# Patient Record
Sex: Female | Born: 1940 | Race: White | Hispanic: No | Smoking: Never smoker
Health system: Southern US, Community
[De-identification: ages and names within clinical notes are randomized; demographics above are authoritative.]

## PROBLEM LIST (undated history)

## (undated) DIAGNOSIS — E785 Hyperlipidemia, unspecified: Secondary | ICD-10-CM

## (undated) DIAGNOSIS — R109 Unspecified abdominal pain: Secondary | ICD-10-CM

## (undated) DIAGNOSIS — M545 Low back pain, unspecified: Secondary | ICD-10-CM

## (undated) DIAGNOSIS — T7840XA Allergy, unspecified, initial encounter: Secondary | ICD-10-CM

## (undated) DIAGNOSIS — I493 Ventricular premature depolarization: Secondary | ICD-10-CM

## (undated) DIAGNOSIS — E876 Hypokalemia: Secondary | ICD-10-CM

## (undated) DIAGNOSIS — K219 Gastro-esophageal reflux disease without esophagitis: Secondary | ICD-10-CM

## (undated) DIAGNOSIS — E538 Deficiency of other specified B group vitamins: Secondary | ICD-10-CM

## (undated) DIAGNOSIS — F419 Anxiety disorder, unspecified: Secondary | ICD-10-CM

## (undated) DIAGNOSIS — I10 Essential (primary) hypertension: Secondary | ICD-10-CM

## (undated) HISTORY — DX: Hyperlipidemia, unspecified: E78.5

## (undated) HISTORY — PX: WRIST SURGERY: SHX841

## (undated) HISTORY — DX: Low back pain: M54.5

## (undated) HISTORY — DX: Allergy, unspecified, initial encounter: T78.40XA

## (undated) HISTORY — PX: CHOLECYSTECTOMY: SHX55

## (undated) HISTORY — DX: Essential (primary) hypertension: I10

## (undated) HISTORY — DX: Low back pain, unspecified: M54.50

## (undated) HISTORY — PX: BREAST SURGERY: SHX581

## (undated) HISTORY — DX: Anxiety disorder, unspecified: F41.9

## (undated) HISTORY — DX: Hypokalemia: E87.6

## (undated) HISTORY — DX: Unspecified abdominal pain: R10.9

## (undated) HISTORY — PX: APPENDECTOMY: SHX54

## (undated) HISTORY — DX: Ventricular premature depolarization: I49.3

## (undated) HISTORY — DX: Deficiency of other specified B group vitamins: E53.8

## (undated) HISTORY — PX: ANKLE SURGERY: SHX546

## (undated) HISTORY — DX: Gastro-esophageal reflux disease without esophagitis: K21.9

## (undated) HISTORY — PX: ABDOMINAL HYSTERECTOMY: SHX81

---

## 2002-05-24 ENCOUNTER — Encounter: Payer: Self-pay | Admitting: Obstetrics and Gynecology

## 2002-05-31 ENCOUNTER — Encounter (INDEPENDENT_AMBULATORY_CARE_PROVIDER_SITE_OTHER): Payer: Self-pay | Admitting: Specialist

## 2002-05-31 ENCOUNTER — Inpatient Hospital Stay (HOSPITAL_COMMUNITY): Admission: RE | Admit: 2002-05-31 | Discharge: 2002-06-02 | Payer: Self-pay | Admitting: Obstetrics and Gynecology

## 2002-05-31 ENCOUNTER — Encounter (INDEPENDENT_AMBULATORY_CARE_PROVIDER_SITE_OTHER): Payer: Self-pay | Admitting: *Deleted

## 2002-06-23 ENCOUNTER — Encounter (INDEPENDENT_AMBULATORY_CARE_PROVIDER_SITE_OTHER): Payer: Self-pay | Admitting: *Deleted

## 2006-11-11 ENCOUNTER — Emergency Department: Payer: Self-pay | Admitting: Emergency Medicine

## 2010-02-04 ENCOUNTER — Encounter (INDEPENDENT_AMBULATORY_CARE_PROVIDER_SITE_OTHER): Payer: Self-pay | Admitting: *Deleted

## 2010-03-08 ENCOUNTER — Encounter (INDEPENDENT_AMBULATORY_CARE_PROVIDER_SITE_OTHER): Payer: Self-pay | Admitting: *Deleted

## 2010-03-12 ENCOUNTER — Ambulatory Visit: Payer: Self-pay | Admitting: Internal Medicine

## 2010-03-26 ENCOUNTER — Ambulatory Visit: Payer: Self-pay | Admitting: Internal Medicine

## 2010-03-26 LAB — HM COLONOSCOPY

## 2010-03-28 ENCOUNTER — Encounter: Payer: Self-pay | Admitting: Internal Medicine

## 2010-04-11 DIAGNOSIS — K5730 Diverticulosis of large intestine without perforation or abscess without bleeding: Secondary | ICD-10-CM | POA: Insufficient documentation

## 2010-04-23 ENCOUNTER — Ambulatory Visit: Payer: Self-pay | Admitting: Internal Medicine

## 2010-08-07 ENCOUNTER — Emergency Department: Payer: Self-pay | Admitting: Emergency Medicine

## 2010-08-16 ENCOUNTER — Ambulatory Visit: Payer: Self-pay | Admitting: General Practice

## 2010-09-17 NOTE — Discharge Summary (Signed)
Summary: Pelvic Relaxation    NAME:  Debra [name], Debra [name]                        ACCOUNT NO.:  [id number]   MEDICAL RECORD NO.:  [id number]                   PATIENT TYPE:  INP   LOCATION:  0440                                 FACILITY:  [facility]   PHYSICIAN:  Katherine Roan, M.D.               DATE OF BIRTH:  07/21/1941   DATE OF ADMISSION:  05/31/2002  DATE OF DISCHARGE:  06/02/2002                                 DISCHARGE SUMMARY   ADMISSION DIAGNOSIS:  Pelvic relaxation for reconstruction.   DISCHARGE DIAGNOSIS:  Pelvic relaxation for reconstruction.   OPERATION PERFORMED:  Vaginal hysterectomy, enterocele and posterior repair.   BRIEF HISTORY:  Debra [name] is a 70 year old female with genital prolapse with  pelvic pressure and desires to be corrected.   LABORATORY DATA:  Cardiogram was within normal limits.  Chest x-ray was  likewise normal.  Hemoglobin was 11.5.  Routine chemistries revealed a  creatinine of .7.   HOSPITAL COURSE:  The patient underwent an uneventful vaginal hysterectomy,  enterocele and posterior repair.  Her postoperative course was  uncomplicated.  She remained afebrile and without complaints.  She was  voiding satisfactorily and was discharged on the second postop day to home  and office care.  She was asked to call for fever, bleeding, or any other  difficulty.  She will return to the office in two weeks.                                               Katherine Roan, M.D.    SDM/MEDQ  D:  06/23/2002  T:  06/23/2002  Job:  [id number]

## 2010-09-17 NOTE — Letter (Signed)
Summary: Previsit letter  [facility]  [address] [place], [place] [address]   Phone: [phone]  Fax: [phone]       02/04/2010 MRN: [id number]  [facility] [address] [place], [place]  [address]  Dear Ms. Kuras,  Welcome to the Gastroenterology Division at [facility].    You are scheduled to see a nurse for your pre-procedure visit on 03-12-10 at 10:30a.m. on the 3rd floor at [facility], [address]  We ask that you try to arrive at our office 15 minutes prior to your appointment time to allow for check-in.  Your nurse visit will consist of discussing your medical and surgical history, your immediate family medical history, and your medications.    Please bring a complete list of all your medications or, if you prefer, bring the medication bottles and we will list them.  We will need to be aware of both prescribed and over the counter drugs.  We will need to know exact dosage information as well.  If you are on blood thinners (Coumadin, Plavix, Aggrenox, Ticlid, etc.) please call our office today/prior to your appointment, as we need to consult with your physician about holding your medication.   Please be prepared to read and sign documents such as consent forms, a financial agreement, and acknowledgement forms.  If necessary, and with your consent, a friend or relative is welcome to sit-in on the nurse visit with you.  Please bring your insurance card so that we may make a copy of it.  If your insurance requires a referral to see a specialist, please bring your referral form from your primary care physician.  No co-pay is required for this nurse visit.     If you cannot keep your appointment, please call [phone] to cancel or reschedule prior to your appointment date.  This allows [place] the opportunity to schedule an appointment for another patient in need of care.    Thank you for choosing [facility] for your medical needs.   We appreciate the opportunity to care for you.  Please visit [place] at our website  to learn more about our practice.                     Sincerely.                                                                                                                   The Gastroenterology Division

## 2010-09-17 NOTE — Assessment & Plan Note (Signed)
Summary: f/u after colonoscopy/dn   History of Present Illness Visit Type: Initial Visit Primary GI MD: Lina Sar MD Primary Tamaya Pun: Gabriel Cirri, MD Chief Complaint: follow-up Colonoscopy History of Present Illness:   This is a 70 year old white female who is status post recent screening colonoscopy on 03/26/10 with findings of a deformed cecal pouch and questionable fistulous tract versus a large diverticulum. Biopsies of the tissue showed normal colonic mucosa. Patient gives a history of a prior appendectomy and cholecystectomy in 1981. She had a pelvic surgery in November 2003 consisting of a vaginal hysterectomy, enterocele and posterior repairby Dr Elana Alm. She has moderately severe diverticulosis of the left, transverse and right colon. There were no polyps. She has regular bowel habits except for occasional mild constipation.   GI Review of Systems      Denies abdominal pain, acid reflux, belching, bloating, chest pain, dysphagia with liquids, dysphagia with solids, heartburn, loss of appetite, nausea, vomiting, vomiting blood, weight loss, and  weight gain.        Denies anal fissure, black tarry stools, change in bowel habit, constipation, diarrhea, diverticulosis, fecal incontinence, heme positive stool, hemorrhoids, irritable bowel syndrome, jaundice, light color stool, liver problems, rectal bleeding, and  rectal pain. Preventive Screening-Counseling & Management  Alcohol-Tobacco     Smoking Status: never    Current Medications (verified): 1)  Loratadine 10 Mg Tabs (Loratadine) .... Take 1 Tablet By Mouth Once A Day 2)  Furosemide 40 Mg Tabs (Furosemide) .... Take 1 Tablet By Mouth Once Daily 3)  Nefazodone Hcl 200 Mg Tabs (Nefazodone Hcl) .... Take 1 Tablet By Mouth Two Times A Day 4)  Toprol Xl 100 Mg Xr24h-Tab (Metoprolol Succinate) .... Take 1 Tablet By Mouth Two Times A Day 5)  Micardis 40 Mg Tabs (Telmisartan) .... Take 1 Tablet By Mouth Once A Day 6)  Omeprazole  20 Mg Cpdr (Omeprazole) .... Take 1 Tablet By Mouth Once A Day 7)  Pravastatin Sodium 40 Mg Tabs (Pravastatin Sodium) .... Take 1 Tablet By Mouth Once A Day 8)  Amlodipine Besylate 5 Mg Tabs (Amlodipine Besylate) .... Take 1 Tablet By Mouth Once A Day 9)  Aspirin 81 Mg Tbec (Aspirin) .... Take 1 Tablet By Mouth Once A Day 10)  Vitamin D3 400 Unit Caps (Cholecalciferol) .... Take 1 Capsule By Mouth Once Daily 11)  Tylenol Arthritis Pain 650 Mg Cr-Tabs (Acetaminophen) .... Take As Needed For Arthritis 12)  Vitamin B-12 500 Mcg Tabs (Cyanocobalamin) .... Take 1 Tablet By Mouth Once Daily 13)  Multivitamins  Tabs (Multiple Vitamin) .... Take 1 Tablet By Mouth Once A Day 14)  Ferrous Sulfate 325 (65 Fe) Mg Tbec (Ferrous Sulfate) .... Take 1 Tablet By Mouth Once A Day  Allergies (verified): 1)  ! Sulfa  Past History:  Past Medical History: Current Problems:  DIVERTICULOSIS OF COLON (ICD-562.10)  HTN Hyperlipidemia Chronic Dermitis  Past Surgical History: Reviewed history from 04/11/2010 and no changes required. Vaginal hysterectomy, enterocele and posterior repair Right breast biopsy Right thumb cyst remove Appendectomy Cholecystectomy  Family History: Reviewed history from 04/11/2010 and no changes required. Family History of Diabetes: Sister No FH of Colon Cancer: Diverticulitis-mother/sister  Social History: Reviewed history from 04/11/2010 and no changes required. Alcohol Use - no Illicit Drug Use - no Patient has never smoked.  Occupation:  retired Married Smoking Status:  never  Review of Systems       The patient complains of arthritis/joint pain, back pain, and skin rash.  The patient denies  allergy/sinus, anemia, anxiety-new, blood in urine, breast changes/lumps, change in vision, confusion, cough, coughing up blood, depression-new, fainting, fatigue, fever, headaches-new, hearing problems, heart murmur, heart rhythm changes, itching, menstrual pain, muscle  pains/cramps, night sweats, nosebleeds, pregnancy symptoms, shortness of breath, sleeping problems, sore throat, swelling of feet/legs, swollen lymph glands, thirst - excessive , urination - excessive , urination changes/pain, urine leakage, vision changes, and voice change.         Pertinent positive and negative review of systems were noted in the above HPI. All other ROS was otherwise negative.   Vital Signs:  Patient profile:   70 year old female Height:      60 inches Weight:      186 pounds BMI:     36.46 Pulse rate:   68 / minute Pulse rhythm:   regular BP sitting:   126 / 62  (left arm)  Vitals Entered By: Milford Cage NCMA (April 23, 2010 2:47 PM)   Impression & Recommendations:  Problem # 1:  DIVERTICULOSIS OF COLON (ICD-562.10) Patient has universal diverticulosis of the left and right colon; predominantly left colon. Patient is asymptomatic. I have suggested for her to start Benefiber 1 tablespoon daily. The abnormality observed on the colonoscopy concerning hercecal pouch deformity may be related to prior abdominal surgeries and has no clinical significance.  Patient Instructions: 1)  high-fiber diet. 2)  Benefiber 1 tablespoon daily. 3)  Recall colonoscopy August 2021 if medically stable. 4)  Copy sent to : Dr Gabriel Cirri, Dr D.McPhail 5)  The medication list was reviewed and reconciled.  All changed / newly prescribed medications were explained.  A complete medication list was provided to the patient / caregiver.

## 2010-09-17 NOTE — Op Note (Signed)
Summary: Hysterectomy, Enterocele Repair, Posterior Repair    NAME:  Debra [name], Debra [name]                        ACCOUNT NO.:  [id number]   MEDICAL RECORD NO.:  [id number]                   PATIENT TYPE:  INP   LOCATION:  0006                                 FACILITY:  [facility]   PHYSICIAN:  Katherine Roan, M.D.               DATE OF BIRTH:  1940/09/24   DATE OF PROCEDURE:  05/31/2002  DATE OF DISCHARGE:                                 OPERATIVE REPORT   PREOPERATIVE DIAGNOSIS:  Genital prolapse.   POSTOPERATIVE DIAGNOSIS:  Genital prolapse.   OPERATION PERFORMED:  Vaginal hysterectomy, enterocele repair and posterior  repair.   DESCRIPTION OF PROCEDURE:  The patient was placed in lithotomy position,  prepped and draped in the usual fashion. The patient was examined under  anesthesia, the uterus was normal size and shape without adnexal masses. The  cervix grasped with a tenaculum after appropriate prep and the posterior cul-  de-sac was incised with sharp dissection. The peritoneum was entered and a  Bonanno weighted speculum was placed posteriorly. The uterosacral ligaments  were skeletonized as were the cardinal ligaments ligated with #0 chromic  suture, transfixion type suture. The cul-de-sac was entered anteriorly,  uterine vessels and upper broad ligament were clamped and ligated with #0  chromic. The specimen was then anteverted through the anterior colpotomy and  the uteroovarian ligament and tube were clamped, specimen was removed and  the pedicle was fashioned with a free tie and a suture ligature of #0  chromic. Hemostasis was secure. Both ovaries appeared normal. We then  dissected into the posterior enterocele repair, dissected the pelvic  diaphragm laterally and then plicated this in the midline with interrupted  sutures of 3-0 Vicryl and 3-0 Ethibond. This affected an excellent closure  following which we closed the peritoneum with 2-0 PDS. Sponge and needle  count was  verified to be correct. The enterocele was then excised and then  closed. The vagina was closed with interrupted sutures of 3-0 Vicryl.  Following this, we dissected in the peritoneal space, wedged out a portion  of the perineum. The rectum was dissected from the overlying prerectal  fascia and this was plicated in the midline with interrupted sutures of #0  Vicryl, 3-0 Vicryl and 3-0 Ethibond. Excellent reduction of the rectocele  accomplished. The flaps of the vagina was then excised and then closed with  interrupted sutures of 3-0 Vicryl. Perineoplasty was then accomplished by  plicating the bulbocavernosus muscle and transverse perinei muscle in the  midline with 3-0 chromic. The skin was closed with a subcuticular 3-0  chromic. The repair was then infiltrated with 20 cc of 0.5% Marcaine with  epinephrine. No unusual blood loss occurred. He was awakened and carried to  the recovery room in good condition. No pack was inserted. The Foley drained  clear urine.  Katherine Roan, M.D.    SDM/MEDQ  D:  05/31/2002  T:  05/31/2002  Job:  [id number]

## 2010-09-17 NOTE — Procedures (Signed)
Summary: Colonoscopy  Patient: [name] Note: All result statuses are Final unless otherwise noted.  Tests: (1) Colonoscopy (COL)   COL Colonoscopy           DONE (C)      [facility]     [address]     [place], [place]  [address]           COLONOSCOPY PROCEDURE REPORT           PATIENT:  Debra, [name]  MR#:  [id number]     BIRTHDATE:  03-Mar-1941, 69 yrs. old  GENDER:  female     ENDOSCOPIST:  Hedwig Morton. Juanda Chance, MD     REF. BY:  Adriana Reams, M.D.     PROCEDURE DATE:  03/26/2010     PROCEDURE:  Colonoscopy 19147     ASA CLASS:  Class III     INDICATIONS:  Routine Risk Screening     MEDICATIONS:   Fentanyl 50 mcg, Versed 4 mg IV           DESCRIPTION OF PROCEDURE:   After the risks benefits and     alternatives of the procedure were thoroughly explained, informed     consent was obtained.  Digital rectal exam was performed and     revealed no rectal masses.   The LB [id number]-[id number] endoscope     was introduced through the anus and advanced to the cecum, which     was identified by both the appendix and ileocecal valve, without     limitations.  The quality of the prep was good, using MiraLax.     The instrument was then slowly withdrawn as the colon was fully     examined.     <<PROCEDUREIMAGES>>           FINDINGS:  A deformity was found in the cecum. deformed cecal     pouch, opening at the site next to appendiceal opening, r/o     fistula, no acute inflammatory changes With standard forceps,     biopsy was obtained and sent to pathology (see image5, image4,     image6, and image7).  Moderate diverticulosis was found throughout     the colon (see image8, image3, image2, and image1).  This was     otherwise a normal examination of the colon (see image9).     Retroflexed views in the rectum revealed no abnormalities.    The     scope was then withdrawn from the patient and the procedure     completed.           COMPLICATIONS:  None     ENDOSCOPIC  IMPRESSION:     1) Deformity in the cecum     2) Moderate diverticulosis throughout the colon     3) Otherwise normal examination     unusual cofiguration of the cecal pouch, small opening next to     an appendiceal opening, r/o Crohn's, biopsies taken     RECOMMENDATIONS:     1) Await biopsy results     2) high fiber diet     REPEAT EXAM:  In 10 year(s) for.           ______________________________     Hedwig Morton. Juanda Chance, MD           CC:           n.     REVISED:  03/29/2010 07:32 AM     eSIGNED:  Hedwig Morton. Brodie at 03/29/2010 07:32 AM           Wynetta Fines, [id number]  Note: An exclamation mark (!) indicates a result that was not dispersed into the flowsheet. Document Creation Date: 03/29/2010 7:33 AM _______________________________________________________________________  (1) Order result status: Final Collection or observation date-time: 03/26/2010 10:56 Requested date-time:  Receipt date-time:  Reported date-time:  Referring Physician:   Ordering Physician: Lina Sar [phone]) Specimen Source:  Source: Launa Grill Order Number: [id number] Lab site:   Appended Document: Colonoscopy     Procedures Next Due Date:    Colonoscopy: 03/2020

## 2010-09-17 NOTE — Miscellaneous (Signed)
Summary: [facility]  Clinical Lists Changes  Medications: Added new medication of MIRALAX   POWD (POLYETHYLENE GLYCOL 3350) As per prep  instructions. - Signed Added new medication of DULCOLAX 5 MG  TBEC (BISACODYL) Day before procedure take 2 at 3pm and 2 at 8pm. - Signed Added new medication of REGLAN 10 MG  TABS (METOCLOPRAMIDE HCL) As per prep instructions. - Signed Rx of MIRALAX   POWD (POLYETHYLENE GLYCOL 3350) As per prep  instructions.;  #255gm x 0;  Signed;  Entered by: Ezra Sites RN;  Authorized by: Hart Carwin MD;  Method used: Electronically to [facility]  [phone], [place], [place], [place]  [address], Ph: [phone], Fax: [phone] Rx of DULCOLAX 5 MG  TBEC (BISACODYL) Day before procedure take 2 at 3pm and 2 at 8pm.;  #4 x 0;  Signed;  Entered by: Ezra Sites RN;  Authorized by: Hart Carwin MD;  Method used: Electronically to [facility]  [phone], [place], [place], [place]  [address], Ph: [phone], Fax: [phone] Rx of REGLAN 10 MG  TABS (METOCLOPRAMIDE HCL) As per prep instructions.;  #2 x 0;  Signed;  Entered by: Ezra Sites RN;  Authorized by: Hart Carwin MD;  Method used: Electronically to [facility] [name], [place], [place], [place]  [address], Ph: [phone], Fax: [phone] Allergies: Added new allergy or adverse reaction of SULFA Observations: Added new observation of NKA: F (03/12/2010 10:18)    Prescriptions: REGLAN 10 MG  TABS (METOCLOPRAMIDE HCL) As per prep instructions.  #2 x 0   Entered by:   Ezra Sites RN   Authorized by:   Hart Carwin MD   Signed by:   Ezra Sites RN on 03/12/2010   Method used:   Electronically to        [facility] [address] (retail)       [address], [address]       [place], [place]  [address]       Ph: [phone]       Fax: [phone]   RxID:    [id number] DULCOLAX 5 MG  TBEC (BISACODYL) Day before procedure take 2 at 3pm and 2 at 8pm.  #4 x 0   Entered by:   Ezra Sites RN   Authorized by:   Hart Carwin MD   Signed by:   Ezra Sites RN on 03/12/2010   Method used:   Electronically to        [facility] [address] (retail)       [address], [address]       [place], [place]  [address]       Ph: [phone]       Fax: [phone]   RxID:   [id number] MIRALAX   POWD (POLYETHYLENE GLYCOL 3350) As per prep  instructions.  #255gm x 0   Entered by:   Ezra Sites RN   Authorized by:   Hart Carwin MD   Signed by:   Ezra Sites RN on 03/12/2010   Method used:   Electronically to        [facility] [address] (retail)       [address], [address]       [place]  [place], [place]  [address]       Ph: [phone]       Fax: [phone]   RxID:   [id number]

## 2010-09-17 NOTE — Letter (Signed)
Summary: [facility] Instructions  [facility]  [address] [place], [place] [address]   Phone: [phone]  Fax: [phone]       Debra [name]    06-01-1941    MRN: [id number]       Procedure Day /Date: Tuesday 03-26-10     Arrival Time:  9:00 a.m.     Procedure Time: 10:00 a.m.     Location of Procedure:                    _x_  [facility] (4th Floor)   PREPARATION FOR COLONOSCOPY WITH MIRALAX  Starting 5 days prior to your procedure 03-21-10 Thursday do not eat nuts, seeds, popcorn, corn, beans, peas,  salads, or any raw vegetables.  Do not take any fiber supplements (e.g. Metamucil, Citrucel, and Benefiber). ____________________________________________________________________________________________________   THE DAY BEFORE YOUR PROCEDURE         DATE:  03-25-10 DAY:  Monday  1   Drink clear liquids the entire day-NO SOLID FOOD  2   Do not drink anything colored red or purple.  Avoid juices with pulp.  No orange juice.  3   Drink at least 64 oz. (8 glasses) of fluid/clear liquids during the day to prevent dehydration and help the prep work efficiently.  CLEAR LIQUIDS INCLUDE: Water Jello Ice Popsicles Tea (sugar ok, no milk/cream) Powdered fruit flavored drinks Coffee (sugar ok, no milk/cream) Gatorade Juice: apple, white grape, white cranberry  Lemonade Clear bullion, consomm, broth Carbonated beverages (any kind) Strained chicken noodle soup Hard Candy  4   Mix the entire bottle of Miralax with 64 oz. of Gatorade/Powerade in the morning and put in the refrigerator to chill.  5   At 3:00 pm take 2 Dulcolax/Bisacodyl tablets.  6   At 4:30 pm take one Reglan/Metoclopramide tablet.  7  Starting at 5:00 pm drink one 8 oz glass of the Miralax mixture every 15-20 minutes until you have finished drinking the entire 64 oz.  You should finish drinking prep around 7:30 or 8:00 pm.  8   If you are nauseated, you may take the 2nd  Reglan/Metoclopramide tablet at 6:30 pm.        9    At 8:00 pm take 2 more DULCOLAX/Bisacodyl tablets.     THE DAY OF YOUR PROCEDURE      DATE:   03-26-10  DAY: Tuesday  You may drink clear liquids until  8:00 a.m.  (2 HOURS BEFORE PROCEDURE).   MEDICATION INSTRUCTIONS  Unless otherwise instructed, you should take regular prescription medications with a small sip of water as early as possible the morning of your procedure.  D Additional medication instructions: Do not take Furosemide day of procedure.                                                         Stop taking Iron 5 days before procedure.         OTHER INSTRUCTIONS  You will need a responsible adult at least 68 years of age to accompany you and drive you home.   This person must remain in the waiting room during your procedure.  Wear loose fitting clothing that is easily removed.  Leave jewelry and other valuables at home.  However, you may wish  to bring a book to read or an iPod/MP3 player to listen to music as you wait for your procedure to start.  Remove all body piercing jewelry and leave at home.  Total time from sign-in until discharge is approximately 2-3 hours.  You should go home directly after your procedure and rest.  You can resume normal activities the day after your procedure.  The day of your procedure you should not:   Drive   Make legal decisions   Operate machinery   Drink alcohol   Return to work  You will receive specific instructions about eating, activities and medications before you leave.   The above instructions have been reviewed and explained to me by   _______________________    I fully understand and can verbalize these instructions _____________________________ Date _______

## 2010-09-17 NOTE — Letter (Signed)
Summary: Patient Notice- Colon Biospy Results  [facility]  [address] [place], [place] [address]   Phone: [phone]  Fax: [phone]        March 28, 2010 MRN: [id number]    [facility] [address] [place], [place]  [address]    Dear Debra [name],  I am pleased to inform you that the biopsies taken during your recent colonoscopy did not show any evidence of cancer upon pathologic examination.The biopsies show normal  small bowl tissue  Additional information/recommendations:  _x_No further action is needed at this time.  Please follow-up with      your primary care physician for your other healthcare needs.  __Please call [phone] to schedule a return visit to review      your condition.  __Continue with the treatment plan as outlined on the day of your      exam.  _x_You should have a repeat colonoscopy examination for this problem           in 10_ years.  Please call us if you are having persistent problems or have questions about your condition that have not been fully answered at this time.  Sincerely,  Hart Carwin MD   This letter has been electronically signed by your physician.  Appended Document: Patient Notice- Colon Biospy Results letter mailed

## 2010-10-10 ENCOUNTER — Ambulatory Visit: Payer: Self-pay | Admitting: General Practice

## 2011-01-03 NOTE — Op Note (Signed)
NAME:  Debra [name], Debra [name]                        ACCOUNT NO.:  [id number]   MEDICAL RECORD NO.:  [id number]                   PATIENT TYPE:  INP   LOCATION:  0006                                 FACILITY:  [facility]   PHYSICIAN:  Katherine Roan, M.D.               DATE OF BIRTH:  1940-11-30   DATE OF PROCEDURE:  05/31/2002  DATE OF DISCHARGE:                                 OPERATIVE REPORT   PREOPERATIVE DIAGNOSIS:  Genital prolapse.   POSTOPERATIVE DIAGNOSIS:  Genital prolapse.   OPERATION PERFORMED:  Vaginal hysterectomy, enterocele repair and posterior  repair.   DESCRIPTION OF PROCEDURE:  The patient was placed in lithotomy position,  prepped and draped in the usual fashion. The patient was examined under  anesthesia, the uterus was normal size and shape without adnexal masses. The  cervix grasped with a tenaculum after appropriate prep and the posterior cul-  de-sac was incised with sharp dissection. The peritoneum was entered and a  Bonanno weighted speculum was placed posteriorly. The uterosacral ligaments  were skeletonized as were the cardinal ligaments ligated with #0 chromic  suture, transfixion type suture. The cul-de-sac was entered anteriorly,  uterine vessels and upper broad ligament were clamped and ligated with #0  chromic. The specimen was then anteverted through the anterior colpotomy and  the uteroovarian ligament and tube were clamped, specimen was removed and  the pedicle was fashioned with a free tie and a suture ligature of #0  chromic. Hemostasis was secure. Both ovaries appeared normal. We then  dissected into the posterior enterocele repair, dissected the pelvic  diaphragm laterally and then plicated this in the midline with interrupted  sutures of 3-0 Vicryl and 3-0 Ethibond. This affected an excellent closure  following which we closed the peritoneum with 2-0 PDS. Sponge and needle  count was verified to be correct. The enterocele was then excised and  then  closed. The vagina was closed with interrupted sutures of 3-0 Vicryl.  Following this, we dissected in the peritoneal space, wedged out a portion  of the perineum. The rectum was dissected from the overlying prerectal  fascia and this was plicated in the midline with interrupted sutures of #0  Vicryl, 3-0 Vicryl and 3-0 Ethibond. Excellent reduction of the rectocele  accomplished. The flaps of the vagina was then excised and then closed with  interrupted sutures of 3-0 Vicryl. Perineoplasty was then accomplished by  plicating the bulbocavernosus muscle and transverse perinei muscle in the  midline with 3-0 chromic. The skin was closed with a subcuticular 3-0  chromic. The repair was then infiltrated with 20 cc of 0.5% Marcaine with  epinephrine. No unusual blood loss occurred. He was awakened and carried to  the recovery room in good condition. No pack was inserted. The Foley drained  clear urine.  Katherine Roan, M.D.    SDM/MEDQ  D:  05/31/2002  T:  05/31/2002  Job:  [id number]

## 2011-01-03 NOTE — Discharge Summary (Signed)
   NAME:  Debra [name], Debra [name]                        ACCOUNT NO.:  [id number]   MEDICAL RECORD NO.:  [id number]                   PATIENT TYPE:  INP   LOCATION:  0440                                 FACILITY:  [facility]   PHYSICIAN:  Katherine Roan, M.D.               DATE OF BIRTH:  06/24/1941   DATE OF ADMISSION:  05/31/2002  DATE OF DISCHARGE:  06/02/2002                                 DISCHARGE SUMMARY   ADMISSION DIAGNOSIS:  Pelvic relaxation for reconstruction.   DISCHARGE DIAGNOSIS:  Pelvic relaxation for reconstruction.   OPERATION PERFORMED:  Vaginal hysterectomy, enterocele and posterior repair.   BRIEF HISTORY:  [name] is a 70 year old female with genital prolapse with  pelvic pressure and desires to be corrected.   LABORATORY DATA:  Cardiogram was within normal limits.  Chest x-ray was  likewise normal.  Hemoglobin was 11.5.  Routine chemistries revealed a  creatinine of .7.   HOSPITAL COURSE:  The patient underwent an uneventful vaginal hysterectomy,  enterocele and posterior repair.  Her postoperative course was  uncomplicated.  She remained afebrile and without complaints.  She was  voiding satisfactorily and was discharged on the second postop day to home  and office care.  She was asked to call for fever, bleeding, or any other  difficulty.  She will return to the office in two weeks.                                               Katherine Roan, M.D.    SDM/MEDQ  D:  06/23/2002  T:  06/23/2002  Job:  [id number]

## 2011-01-03 NOTE — H&P (Signed)
NAME:  Debra [name], Debra [name]                        ACCOUNT NO.:  [id number]   MEDICAL RECORD NO.:  [id number]                   PATIENT TYPE:  INP   LOCATION:  NA                                   FACILITY:  [facility]   PHYSICIAN:  Katherine Roan, M.D.               DATE OF BIRTH:  04/30/1941   DATE OF ADMISSION:  05/31/2002  DATE OF DISCHARGE:                                HISTORY & PHYSICAL   CHIEF COMPLAINT:  Pelvic pressure and feeling that things are falling out.   HISTORY OF PRESENT ILLNESS:  The patient is a 70 year old para 3,  postmenopausal patient on Premarin and Provera who has pelvic relaxation  with complex enterocele, rectocele, complaining of pelvic pressure and  feeling that things are falling out and is desirous of correction.  She has  had a history of abnormal Pap with endometrial and endocervical biopsies and  cervical biopsies being normal.  Her comorbidities include hypertension.  She is on Lotrel, Toprol, Avapro, Zyrtec, Zocor, Detrol as needed, and  Serzone.  She also takes Premarin and Provera.   PAST MEDICAL HISTORY:  1. Gallbladder with appendix.  2. Fractured ankle x 2.  3. Right breast biopsy.  4. Right thumb cyst removed.   ALLERGIES:  SULFA.   REVIEW OF SYSTEMS:  HEENT:  She wears glasses but noted no decrease in  visual or auditory acuity.  No dizziness.  No headaches.  HEART:  She has a  history of hypertension.  Denies chest pain.  Denies shortness of breath.  She denies history of mitral valve prolapse or heart murmur.  LUNGS:  No  chronic cough.  No asthma.  No shortness of breath.  No hemoptysis.  GENITOURINARY:  She denies stress incontinence and does have frequency and  at times has urge.  GASTROINTESTINAL:  She had gallbladder surgery in the  past, but no bowel habit change.  No melena.  She denies reflux at this  time.  MUSCLES, BONES, AND JOINTS:  She had a cyst removed from her right  thumb.  She has no arthritis and has no complaints  otherwise.   SOCIAL HISTORY:  She is a Futures trader.  Does not smoke or drink.   FAMILY HISTORY:  Her mother is living at 55.  Her father died at 33.  She  has four brothers who are living and one sister who is living and well.  She  has a brother and sister with hypertension and a sister with diabetes.   PHYSICAL EXAMINATION:  GENERAL:  Well-developed, nourished female who  appears to be her stated age of 45.  She is oriented to time, place, and  recent events.  VITAL SIGNS:  Weight 214.  Blood pressure 120/80.  HEENT:  Ears, nose, and throat are unremarkable.  The oropharynx is not  injected.  NECK:  Supple.  Thyroid is in the midline.  Her thyroid is normal.  Trachea  is in the midline.  Carotids are equal, without bruits.  No adenopathy  appreciated.  BREASTS:  No masses or tenderness.  LUNGS:  Clear to P&A.  HEART:  Normal sinus rhythm.  No murmurs.  ABDOMEN:  Somewhat obese.  No abdominal masses are noted.  EXTREMITIES:  Trace of edema.  Good reflexes and pulses equally.  PELVIC:  Bartholin, Skene's, and urethra are normal.  There is a fairly  large cystocele and enterocele component.  Minimal rectocele.  The uterus is  fairly mobile, without masses.  RECTAL:  Hemoccult is negative.   IMPRESSION:  Symptomatic posterior segment relaxation.   PLAN:  Vaginal hysterectomy and complex pelvic repair with enterocele and  rectocele.  The risks and benefits and detailed informed consent have been  given to the patient.                                                Katherine Roan, M.D.    SDM/MEDQ  D:  05/27/2002  T:  05/27/2002  Job:  [id number]

## 2011-01-16 ENCOUNTER — Emergency Department: Payer: Self-pay | Admitting: Emergency Medicine

## 2011-08-26 ENCOUNTER — Emergency Department: Payer: Self-pay | Admitting: Emergency Medicine

## 2011-08-26 LAB — COMPREHENSIVE METABOLIC PANEL
Albumin: 4 g/dL (ref 3.4–5.0)
Alkaline Phosphatase: 110 U/L (ref 50–136)
Anion Gap: 15 (ref 7–16)
BUN: 8 mg/dL (ref 7–18)
Bilirubin,Total: 0.9 mg/dL (ref 0.2–1.0)
Calcium, Total: 9.6 mg/dL (ref 8.5–10.1)
Chloride: 101 mmol/L (ref 98–107)
Co2: 22 mmol/L (ref 21–32)
Creatinine: 0.38 mg/dL — ABNORMAL LOW (ref 0.60–1.30)
EGFR (African American): >60
EGFR (Non-African Amer.): >60
Glucose: 89 mg/dL (ref 65–99)
Osmolality: 273 (ref 275–301)
Potassium: 4.1 mmol/L (ref 3.5–5.1)
SGOT(AST): 31 U/L (ref 15–37)
SGPT (ALT): 20 U/L
Sodium: 138 mmol/L (ref 136–145)
Total Protein: 7.3 g/dL (ref 6.4–8.2)

## 2011-08-26 LAB — URINALYSIS, COMPLETE
Bacteria: NONE SEEN
Bilirubin,UR: NEGATIVE
Glucose,UR: NEGATIVE mg/dL (ref 0–75)
Nitrite: NEGATIVE
Ph: 7 (ref 4.5–8.0)
Protein: NEGATIVE
RBC,UR: 3 /HPF (ref 0–5)
Specific Gravity: 1.013 (ref 1.003–1.030)
Squamous Epithelial: 2
WBC UR: 3 /HPF (ref 0–5)

## 2011-08-26 LAB — CBC
HCT: 39 % (ref 35.0–47.0)
HGB: 12.5 g/dL (ref 12.0–16.0)
MCH: 25.7 pg — ABNORMAL LOW (ref 26.0–34.0)
MCHC: 32.1 g/dL (ref 32.0–36.0)
MCV: 80 fL (ref 80–100)
Platelet: 274 10*3/uL (ref 150–440)
RBC: 4.88 10*6/uL (ref 3.80–5.20)
RDW: 15.9 % — ABNORMAL HIGH (ref 11.5–14.5)
WBC: 8.4 10*3/uL (ref 3.6–11.0)

## 2011-08-26 LAB — LIPASE, BLOOD: Lipase: 92 U/L (ref 73–393)

## 2012-06-16 IMAGING — CT CT ABD-PELV W/ CM
1 of 2 series · 15 of 32 positions shown, 19 images · non-contrast
Comparison: none

REASON FOR EXAM: (1) abd pain, vomiting; (2) abd pain, vomiting
COMMENTS:

[Series 2: 3mm soft tissue · axial · 0.87mm/px · z∈[-988,-626]mm · 15 of 133 slices shown, 19 images]
[im 6/133  soft-tissue]
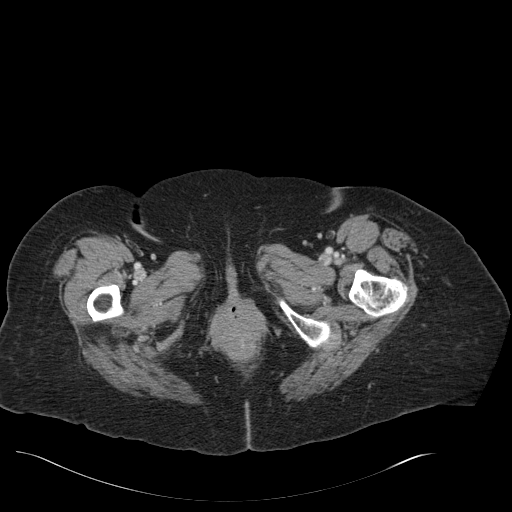
[im 6/133  bone]
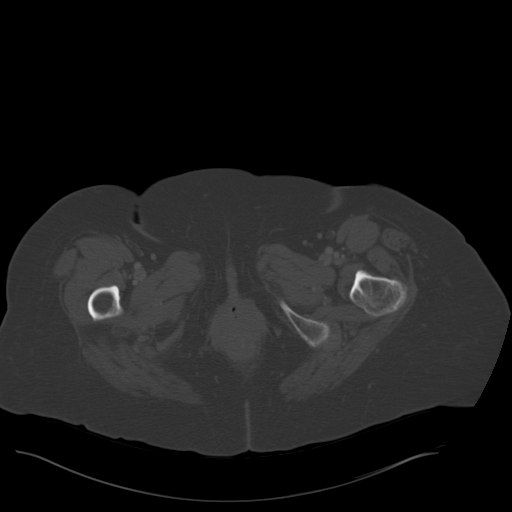
[im 16/133  soft-tissue]
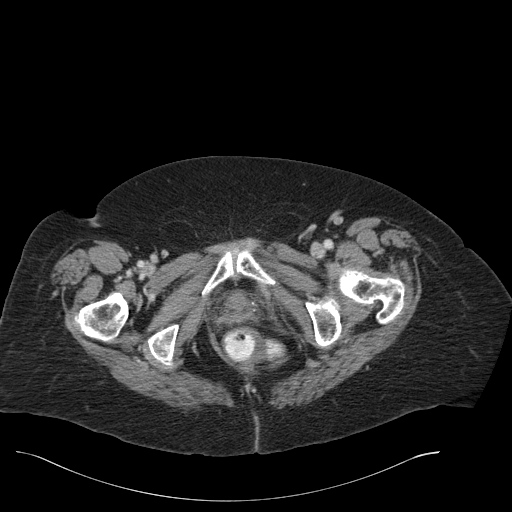
[im 27/133  soft-tissue]
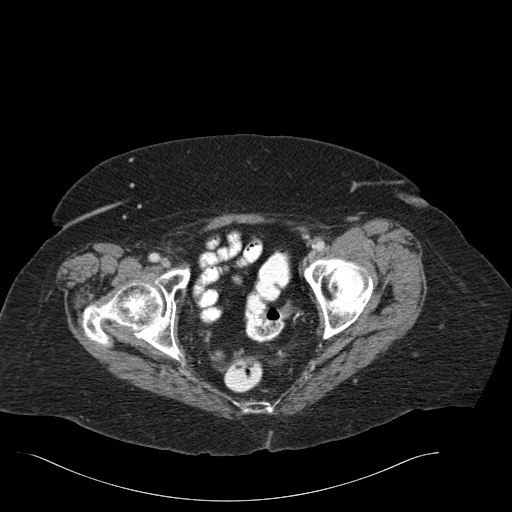
[im 37/133  soft-tissue]
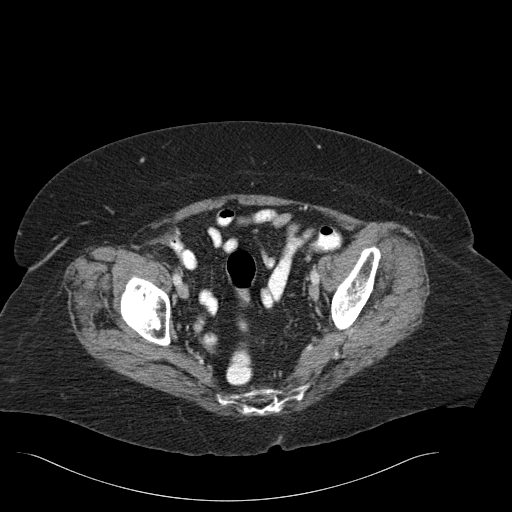
[im 48/133  soft-tissue]
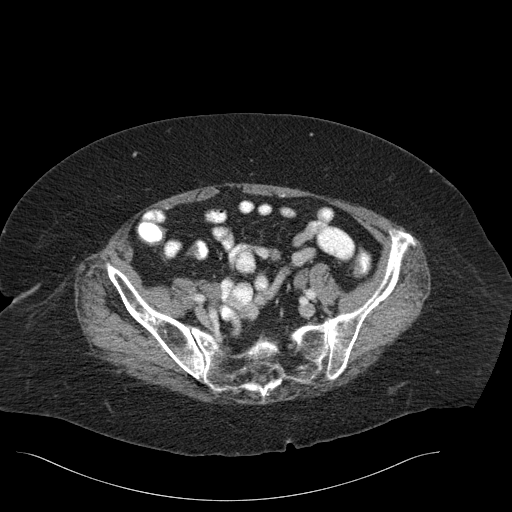
[im 59/133  soft-tissue]
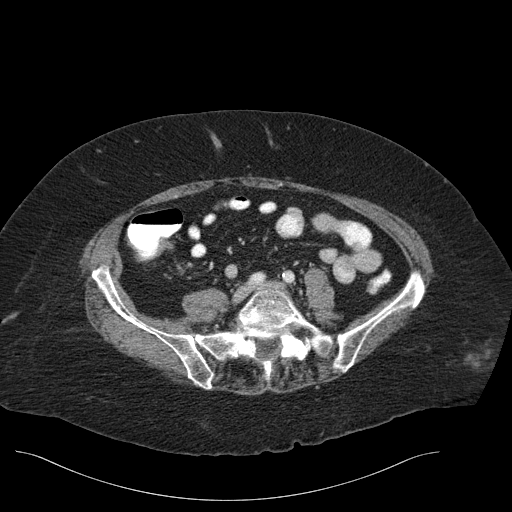
[im 69/133  soft-tissue]
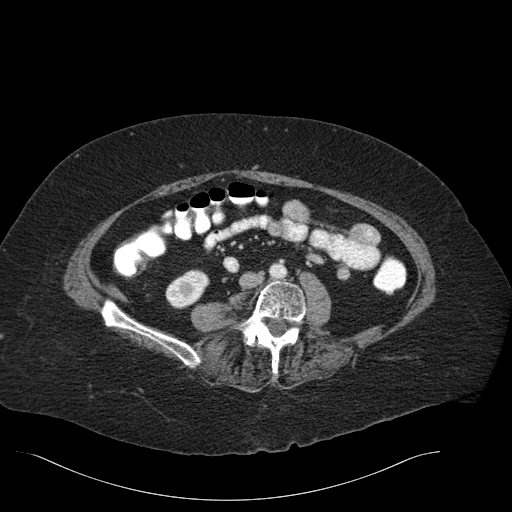
[im 74/133  soft-tissue]
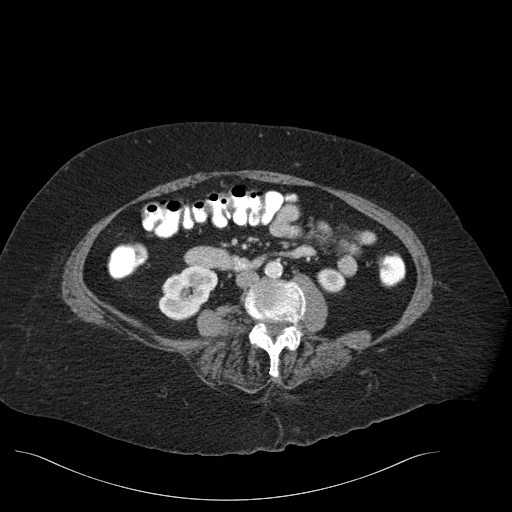
[im 85/133  soft-tissue]
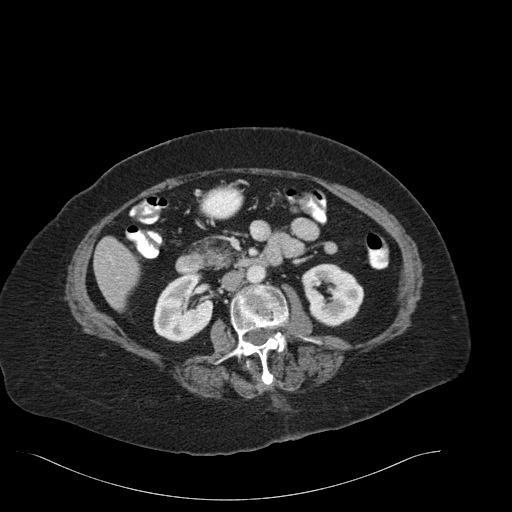
[im 85/133  bone]
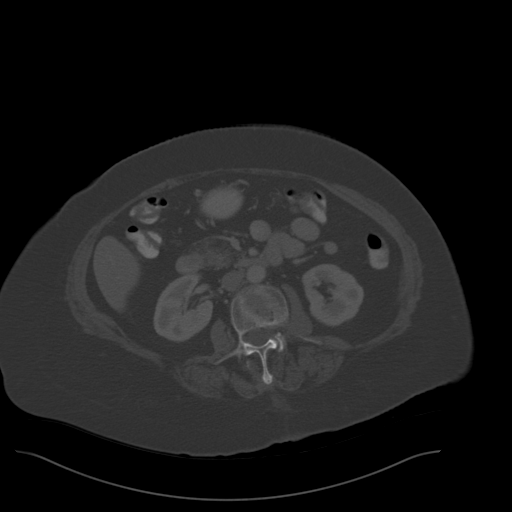
[im 96/133  soft-tissue]
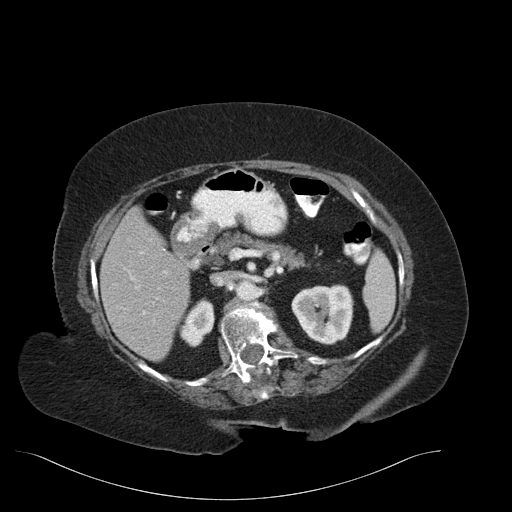
[im 106/133  soft-tissue]
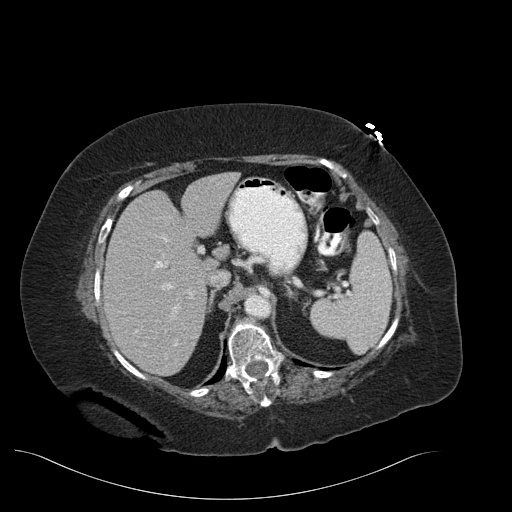
[im 111/133  lung]
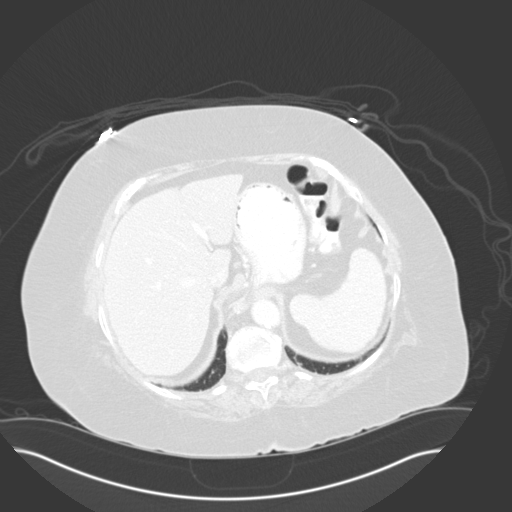
[im 117/133  soft-tissue]
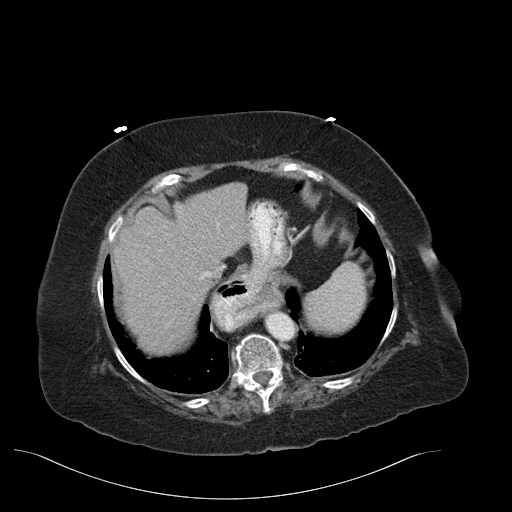
[im 117/133  lung]
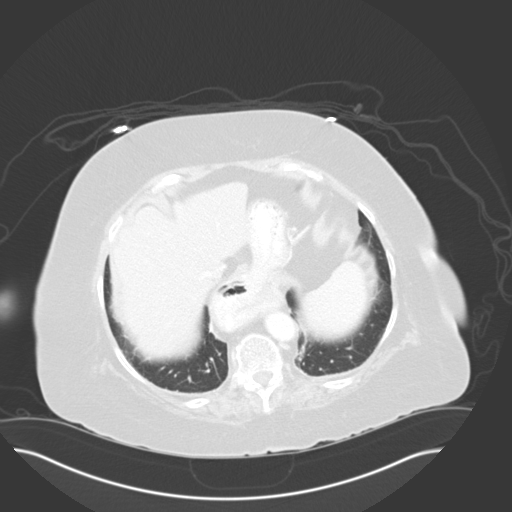
[im 122/133  lung]
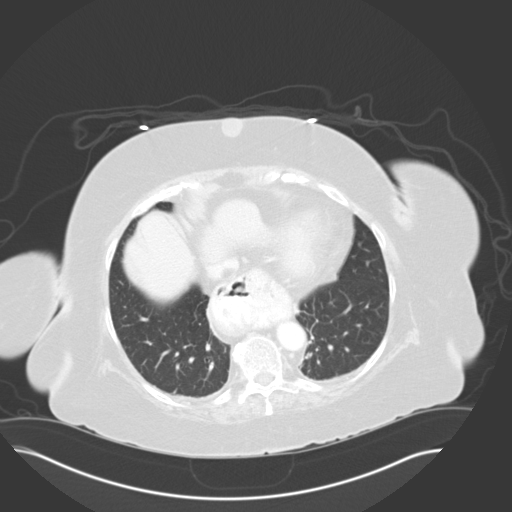
[im 127/133  soft-tissue]
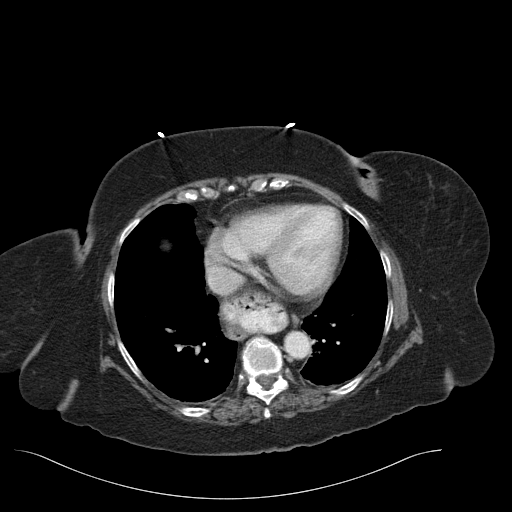
[im 127/133  lung]
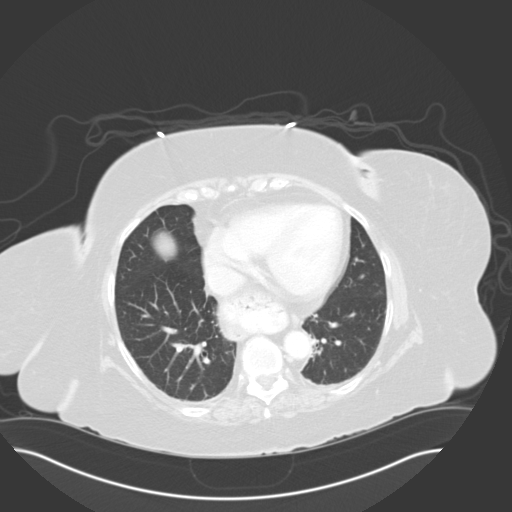

[15 of 32 positions shown; findings below may reference images not displayed]

PROCEDURE:     CT  - CT ABDOMEN / PELVIS  W  - August 26, 2011  [DATE]

RESULT:     CT of the abdomen and pelvis is performed with 100 mL of
Esovue-8VZ iodinated intravenous contrast. Oral contrast was also utilized.
There is no previous exam for comparison.

The lung bases are clear. A moderate sized hiatal hernia is present. The
liver shows no intrahepatic biliary ductal dilation or mass. There is no
evidence of ascites. The spleen enhances homogeneously. The adrenal glands
appear unremarkable. The kidneys show no obstruction, surrounding
perinephric fluid collection or definite mass. There is a small stone in the
upper pole collecting system of the right kidney with a second even smaller
stone in that region. Anterior lower pole right renal calculus is present.
No definite left renal stones are identified. No ureteral calculi are
demonstrated. The urinary bladder is nondistended. The appendix is not
identified. The ovaries appear to be unremarkable. It appears the patient is
status post hysterectomy. Correlate with surgical history. The bony
structures appear to be within normal limits. Mild scoliosis is seen concave
to the right in the lower lumbar region with a thoracic scoliosis concave
left demonstrated more superiorly. Scattered mild sigmoid colon
diverticulosis is seen. The abdominal wall appears intact. A subcutaneous
nodular density best appreciated on image 12 measures up to approximately
1.9 cm and may represent a sebaceous cyst or other subcutaneous nodular
density.
IMPRESSION: 1. Moderately large hiatal hernia.
2. Scattered colonic diverticulosis most prominent in the sigmoid region
without evidence of acute diverticulitis.
3. Right nephrolithiasis without obstruction.
4. Status post cholecystectomy and hysterectomy.

## 2013-04-19 ENCOUNTER — Ambulatory Visit: Payer: Self-pay | Admitting: General Practice

## 2013-04-19 LAB — BASIC METABOLIC PANEL
Anion Gap: 5 — ABNORMAL LOW (ref 7–16)
BUN: 13 mg/dL (ref 7–18)
Calcium, Total: 9.1 mg/dL (ref 8.5–10.1)
Chloride: 106 mmol/L (ref 98–107)
Co2: 29 mmol/L (ref 21–32)
Creatinine: 0.72 mg/dL (ref 0.60–1.30)
EGFR (African American): >60
Osmolality: 279 (ref 275–301)

## 2013-04-19 LAB — URINALYSIS, COMPLETE
Bilirubin,UR: NEGATIVE
Glucose,UR: NEGATIVE mg/dL (ref 0–75)
Ketone: NEGATIVE
Nitrite: NEGATIVE
Ph: 6 (ref 4.5–8.0)
Protein: 75
RBC,UR: NONE SEEN /HPF (ref 0–5)

## 2013-04-19 LAB — CBC
HCT: 35.8 % (ref 35.0–47.0)
HGB: 12.2 g/dL (ref 12.0–16.0)
MCH: 28.9 pg (ref 26.0–34.0)
MCHC: 33.9 g/dL (ref 32.0–36.0)
MCV: 85 fL (ref 80–100)
Platelet: 215 10*3/uL (ref 150–440)
RBC: 4.2 10*6/uL (ref 3.80–5.20)
RDW: 14.8 % — ABNORMAL HIGH (ref 11.5–14.5)
WBC: 6.8 10*3/uL (ref 3.6–11.0)

## 2013-04-19 LAB — SEDIMENTATION RATE: Erythrocyte Sed Rate: 29 mm/hr (ref 0–30)

## 2013-04-19 LAB — MRSA PCR SCREENING

## 2013-06-13 ENCOUNTER — Ambulatory Visit: Payer: Self-pay | Admitting: General Practice

## 2013-06-13 LAB — URINALYSIS, COMPLETE
Blood: NEGATIVE
Glucose,UR: NEGATIVE mg/dL (ref 0–75)
Nitrite: NEGATIVE
Protein: NEGATIVE

## 2013-06-13 LAB — SEDIMENTATION RATE: Erythrocyte Sed Rate: 48 mm/hr — ABNORMAL HIGH (ref 0–30)

## 2013-06-13 LAB — CBC
HGB: 12.2 g/dL (ref 12.0–16.0)
MCH: 29.3 pg (ref 26.0–34.0)
MCHC: 34 g/dL (ref 32.0–36.0)
MCV: 86 fL (ref 80–100)
RBC: 4.17 10*6/uL (ref 3.80–5.20)

## 2013-06-13 LAB — PROTIME-INR: Prothrombin Time: 13.6 secs (ref 11.5–14.7)

## 2013-06-13 LAB — BASIC METABOLIC PANEL
Calcium, Total: 9.1 mg/dL (ref 8.5–10.1)
Chloride: 104 mmol/L (ref 98–107)
Co2: 31 mmol/L (ref 21–32)
Glucose: 86 mg/dL (ref 65–99)

## 2013-06-13 LAB — APTT: Activated PTT: 32.6 secs (ref 23.6–35.9)

## 2013-06-15 LAB — URINE CULTURE

## 2013-06-27 ENCOUNTER — Inpatient Hospital Stay: Payer: Self-pay | Admitting: General Practice

## 2013-06-28 LAB — BASIC METABOLIC PANEL
Anion Gap: 6 — ABNORMAL LOW (ref 7–16)
BUN: 12 mg/dL (ref 7–18)
Chloride: 104 mmol/L (ref 98–107)
Creatinine: 0.96 mg/dL (ref 0.60–1.30)
EGFR (African American): >60
Glucose: 104 mg/dL — ABNORMAL HIGH (ref 65–99)
Osmolality: 274 (ref 275–301)

## 2013-06-29 LAB — PLATELET COUNT: Platelet: 187 10*3/uL (ref 150–440)

## 2013-06-29 LAB — BASIC METABOLIC PANEL
Co2: 22 mmol/L (ref 21–32)
Creatinine: 0.84 mg/dL (ref 0.60–1.30)
EGFR (Non-African Amer.): >60
Osmolality: 273 (ref 275–301)
Sodium: 136 mmol/L (ref 136–145)

## 2013-06-29 LAB — PATHOLOGY REPORT

## 2013-06-29 LAB — HEMOGLOBIN: HGB: 9.3 g/dL — ABNORMAL LOW (ref 12.0–16.0)

## 2013-06-30 LAB — BASIC METABOLIC PANEL
Creatinine: 0.46 mg/dL — ABNORMAL LOW (ref 0.60–1.30)
Osmolality: 277 (ref 275–301)
Sodium: 140 mmol/L (ref 136–145)

## 2014-03-10 LAB — HM MAMMOGRAPHY

## 2014-09-25 DIAGNOSIS — I8312 Varicose veins of left lower extremity with inflammation: Secondary | ICD-10-CM | POA: Diagnosis not present

## 2014-09-25 DIAGNOSIS — I8311 Varicose veins of right lower extremity with inflammation: Secondary | ICD-10-CM | POA: Diagnosis not present

## 2014-10-13 DIAGNOSIS — E785 Hyperlipidemia, unspecified: Secondary | ICD-10-CM | POA: Diagnosis not present

## 2014-10-13 DIAGNOSIS — E876 Hypokalemia: Secondary | ICD-10-CM | POA: Diagnosis not present

## 2014-10-13 DIAGNOSIS — J309 Allergic rhinitis, unspecified: Secondary | ICD-10-CM | POA: Diagnosis not present

## 2014-10-13 DIAGNOSIS — I10 Essential (primary) hypertension: Secondary | ICD-10-CM | POA: Diagnosis not present

## 2014-11-06 DIAGNOSIS — I8311 Varicose veins of right lower extremity with inflammation: Secondary | ICD-10-CM | POA: Diagnosis not present

## 2014-11-06 DIAGNOSIS — I8312 Varicose veins of left lower extremity with inflammation: Secondary | ICD-10-CM | POA: Diagnosis not present

## 2014-12-08 NOTE — Op Note (Signed)
PATIENT NAME:  Debra [name] MR#:  [id number] [name] DATE OF BIRTH:  Feb 14, 1941  DATE OF PROCEDURE:  06/27/2013  PREOPERATIVE DIAGNOSIS: Degenerative arthrosis of the right hip.   POSTOPERATIVE DIAGNOSIS: Degenerative arthrosis of the right hip.   PROCEDURE PERFORMED: Right total hip arthroplasty.   SURGEON: Debra [name], M.D.   ASSISTANT: Debra ClinesJon Wolfe, PA (required to maintain retraction throughout the procedure).   ANESTHESIA: Spinal.   ESTIMATED BLOOD LOSS: 450 mL.   FLUIDS REPLACED: 2000 mL of crystalloid.   DRAINS: Two medium drains to Hemovac reservoir.   IMPLANTS UTILIZED: DePuy 13.5 mm small stature AML femoral stem, a 50 mm outer diameter Pinnacle Gription Sector acetabular shell, +4 mm neutral Pinnacle Marathon polyethylene liner, 32 mm outer diameter cobalt chrome hip ball with a + 1 mm neck length and a 6.5 mm x 25 mm cancellous bone screw.  INDICATIONS FOR SURGERY: The patient is a 74 year old female, who has been seen for complaints of severe progressive right hip and groin pain. X-rays demonstrated severe degenerative changes with full thickness loss of articular cartilage and erosive changes. After discussion of the risks and benefits of surgical intervention, the patient expressed understanding of the risks, benefits, and agreed with plans for surgical intervention.   PROCEDURE IN DETAIL: The patient is brought in the operating room and then, after adequate spinal anesthesia was achieved, the patient was placed in the left lateral decubitus position. Axillary roll was placed and all bony prominences were well padded. The patient'[name] right hip and leg were cleaned and prepped with alcohol and DuraPrep and draped in the usual sterile fashion. A "timeout" was performed as per usual protocol. A lateral curvilinear incision was made gently curving towards the posterior superior iliac spine. IT band was incised in line with the skin incision. Fibers of the gluteus maximus were split in  line. Piriformis tendon was identified, skeletonized, and incised at its insertion at the proximal femur and reflected posteriorly. In a similar fashion, the short external rotators were incised and reflected posteriorly. A T-type posterior capsulotomy was performed. Due to the thickness of the patient'[name] adipose tissue, it was elected not to attempt a threaded Steinmann pin for limb length assessment. Instead, distal measurements were obtained with the knees flexed to 90 degrees. The femoral head was then dislocated posteriorly. Severe degenerative changes were noted with flattening of the superior femoral head and gross cystic changes. The femoral neck cut was performed using an oscillating saw. The anterior capsule was elevated off the femoral neck. Inspection of the acetabulum also demonstrated severe degenerative changes with cystic lesions noted as well as some erosive changes noted to the posterosuperior aspect of the acetabulum. Labrum was excised. The acetabulum was carefully reamed in a sequential fashion up to a 49 mm diameter. Good punctate bleeding bone was encountered.   A 50 mm outer diameter Pinnacle Gription Sector acetabular component was positioned and impacted into place. Good scratch fit was appreciated. It was elected to further secure the cup with a 6.5 mm x 25 mm dome screw. Good purchase was appreciated. A +4 mm neutral polyethylene trial was inserted and attention was directed to the proximal femur. Pilot hole for reaming of the proximal femoral canal was created using a high-speed burr. Proximal femoral canal was reamed in a sequential fashion up to a 13 mm diameter. This allowed for approximately 5.5 cm of scratch fit. The proximal femur was then prepared using a 13.5 mm aggressive side-biting reamer. Serial broaches were inserted up to a  13.5 mm small stature broach. The calcar region was planed accordingly and a trial reduction was performed using a 32 mm hip ball with a + 1 mm neck  length. Excellent stability was appreciated both anteriorly and posteriorly. Good approximation of limb lengths was noted. Trial hip ball was removed. The trial polyethylene was removed. The acetabular shell was irrigated and suctioned dry. A +4 mm neutral Pinnacle Marathon polyethylene liner was positioned and impacted into place. Locking was confirmed.   Next, a 13.5 mm small stature AML femoral component was positioned and impacted into place. Good scratch fit was appreciated. The Morse taper was clean and dry. A 32 mm cobalt chrome hip ball with a + 1 mm neck length was placed on the trunnion and impacted into place. The hip was then reduced and placed through range of motion. Good equalization of limb lengths was appreciated. Excellent stability was noted both anteriorly and posteriorly. The wound was irrigated with copious amounts of normal saline with antibiotic solution using pulsatile lavage and then suctioned dry. Posterior capsulotomy was repaired using #5 Ethibond. The piriformis tendon was reapproximated on the undersurface of the gluteus medius tendon using #5 Ethibond. Some attenuation had been noted originally with some of the fibers of the gluteus medius tendon anteriorly. These were reinforced using #5 Ethibond. Two medium drains were placed in the wound bed and brought out through a separate stab incision to be attached to a Hemovac reservoir. IT band was repaired using interrupted sutures of #1 Vicryl. The subcutaneous tissue was approximated in layers using first #0 Vicryl followed by 2-0 Vicryl. Skin was closed with skin staples. Sterile dressing was applied. The patient tolerated the procedure well. She was transported to the recovery room in stable condition. ____________________________ Debra [name]. Debra [name]., MD jph:aw D: 06/27/2013 11:53:49 ET T: 06/27/2013 12:25:06 ET JOB#: [id number]  cc: Debra [name]. Debra [name]., MD, <Dictator> Debra [name] Debra Fava MD ELECTRONICALLY SIGNED 06/28/2013  21:43

## 2014-12-08 NOTE — Discharge Summary (Signed)
PATIENT NAME:  Debra [name], Debra [name] MR#:  [id number] DATE OF BIRTH:  [birth year]  DATE OF ADMISSION:  06/27/2013 DATE OF DISCHARGE: 06/30/2013.   ADMITTING DIAGNOSIS: Degenerative arthrosis of the right hip.   DISCHARGE DIAGNOSIS: Degenerative arthrosis of the right hip.   HISTORY: The patient is a pleasant 74 year old female who has been followed at [facility] for progression of right hip pain. She has continued to have significant right hip and groin pain, especially with weight-bearing activities. At the time of surgery, she had gone to using a walker due to the severity of pain. The patient also reported a "catching" sensation with ambulation. She has appreciated a decrease in her range of motion. She reported only modest improvement in her condition with the use of ibuprofen. The patient states that the pain had increased to the point that it was significantly interfering with her activities of daily living. She notes no injury or any change of activities, which may have led to this onset of discomfort. [facility] taken [facility]19[facility]2013 showed significant narrowing of the cartilage space mainly superiorly. She was noted to have subchondral sclerosis as well as subchondral cyst formation. After discussion of the risks and benefits of surgical intervention, the patient expressed her understanding of the risks and benefits and agreed for plans for surgical intervention.   HOSPITAL COURSE:   PROCEDURE: Right total hip arthroplasty.   IMPLANTS UTILIZED: DePuy 13.5 mm small stature AML femoral stem, a 50 mm outer diameter Pinnacle Gription Sector acetabular shell, +4 mm neutral Pinnacle Marathon polyethylene liner, 32 mm outer diameter cobalt chrome hip ball with a +1 mm neck length and a 6.5 mm x 25 mm cancellus bone screw.   The patient tolerated the procedure very well. She had no complications. She was then taken to the PAC-U where she was stabilized and then transferred to  the orthopedic floor. The patient began receiving anticoagulation therapy of Lovenox 30 mg subcutaneous every 12 hours per anesthesia and pharmacy protocol. She was fitted with TED stockings bilaterally. These were allowed to be removed one hour per eight hour shift. The patient was also fitted with the AV-I compression foot pumps bilaterally set at 80 mmHg. Her calves have been elevated off the bed using rolled towels. There has been no evidence of any deep venous thromboses to the lower extremity. Negative Homans sign. Calves have been nontender.   The patient has denied any chest pain or shortness of breath. Vital signs have been stable. She has been afebrile. Hemodynamically she was stable. No transfusions were given. The patient did become slightly hypotensive during the hospital course and subsequently this was felt to be secondary to inactivity and her blood pressure medicine was held for one day and restarted after she normalized. She had no symptoms during this time frame.   The patient began receiving physical therapy on day one for gait training and transfers. This has been extremely slow. She has been only able to ambulate 12 feet at the most. She has had some difficulty with doing the exercises in bed as well. Occupational therapy was also initiated on day one for ADLs and assistive devices.   The patient's IV, Foley and Hemovac were discontinued on day two along with a dressing change. The wound was free of any drainage or signs of infection. Follow-up dressing change showed serosanguineous drainage but no bleeding. This was later changed to a Covaderm.   DISPOSITION: The patient is being discharged to rehab facility in  improved condition.   DISCHARGE INSTRUCTIONS: 1. She may weight bear as tolerated. She is to elevate the lower extremities off the bed using rolled towels. Posterior hip precautions were gone over once again.  2. Continue with TED stockings bilaterally. These are to be  removed one hour per eight hour shift. Encourage incentive spirometer q.1 h. while awake.  3. Encourage cough, deep breathing every two hours while awake.  4. She is placed on a regular diet. Change dressing as needed. Staples will be removed on 07/07/2013. We will need to apply benzoin and Steri-Strips.  5. She is to follow up in the office on 12/23 at 10:30. She is to call the clinic sooner if any complications. She is not to take a shower until staples are removed at two weeks postoperative.  6. She will continue with PT for gait training and transfers. Occupational therapy for ADLs and assistive devices.   DRUG ALLERGIES: LOTENSIN, SULFA DRUGS, TRAMADOL,  BAND-AIDS, LASIX.   MEDICATIONS: Amlodipine 5 mg q.a.m., calcium citrate 1 tablet daily. Vitamin D3 1000 units daily. Senokot-S 1 tablet b.i.d., fluoxetine 20 mg q.a.m., Lasix 20 mg q.a.m., lisinopril 20 mg q.a.m., Metoprolol  100 mg b.i.d., Prilosec 20 mg at bedtime. Potassium chloride 20 mEq daily, pravastatin 40 mg at bedtime, Lovenox 30 mg subcutaneous every 12 hours for 14 days, then discontinue and begin taking one 81 mg enteric-coated aspirin unless contraindicated. Tylenol ES 500 to 1000 mg q.4 - 6h. p.r.n. for pain., Mylanta DS 30 mL every six hours p.r.n., Dulcolax suppositories 10 mg rectally p.r.n. for constipation. Claritin 10 mg daily p.r.n., oxycodone 5 to 10 mg every 4 to 6 hours p.r.n. for pain. Enema soapsuds if no results with milk of magnesia or Dulcolax.   PAST MEDICAL HISTORY: Seasonal allergies. History of asthma, hypertension, hypercholesterolemia, hemorrhoids, gastroesophageal reflux disease, diverticulosis. ____________________________ Van ClinesJon Wolfe, PA jrw:sg D: 06/30/2013 08:00:07 ET T: 06/30/2013 08:43:18 ET JOB#: [id number]  cc: Van ClinesJon Wolfe, PA, <Dictator> JON WOLFE PA ELECTRONICALLY SIGNED 07/03/2013 20:08

## 2015-04-16 ENCOUNTER — Other Ambulatory Visit: Payer: Self-pay

## 2015-04-16 MED ORDER — AMLODIPINE BESYLATE 5 MG PO TABS: 5.0000 mg | ORAL_TABLET | Freq: Every day | ORAL | Status: DC

## 2015-04-16 MED ORDER — FLUOXETINE HCL 20 MG PO CAPS: 20.0000 mg | ORAL_CAPSULE | Freq: Every day | ORAL | Status: DC

## 2015-04-16 MED ORDER — POTASSIUM CHLORIDE CRYS ER 20 MEQ PO TBCR: 20.0000 meq | EXTENDED_RELEASE_TABLET | Freq: Every day | ORAL | Status: DC

## 2015-04-16 MED ORDER — LISINOPRIL 20 MG PO TABS: 20.0000 mg | ORAL_TABLET | Freq: Every day | ORAL | Status: DC

## 2015-04-16 MED ORDER — PRAVASTATIN SODIUM 40 MG PO TABS: 40.0000 mg | ORAL_TABLET | Freq: Every day | ORAL | Status: DC

## 2015-04-16 MED ORDER — FLUTICASONE PROPIONATE 50 MCG/ACT NA SUSP: 2.0000 | Freq: Every day | NASAL | Status: AC

## 2015-04-16 NOTE — Telephone Encounter (Signed)
Pt needs to be seen

## 2015-04-16 NOTE — Telephone Encounter (Signed)
Patient was last seen on 10/13/14, practice partner number is [phone], and pharmacy is [facility].

## 2015-04-19 DIAGNOSIS — Z1231 Encounter for screening mammogram for malignant neoplasm of breast: Secondary | ICD-10-CM | POA: Diagnosis not present

## 2015-04-19 NOTE — Telephone Encounter (Signed)
Called and left patient a voicemail asking for her to return my call and schedule a follow-up visit. 

## 2015-04-20 ENCOUNTER — Other Ambulatory Visit: Payer: Self-pay

## 2015-04-20 NOTE — Progress Notes (Signed)
Order(s) created erroneously. Erroneous order ID: [id number]  Order moved by: Zana Biancardi A  Order move date/time: 04/20/2015 10:31 AM  Source Patient: [id number]  Source Contact: 04/20/2015  Destination Patient: [id number]  Destination Contact: 11/02/2012 

## 2015-04-20 NOTE — Progress Notes (Signed)
Order(s) created erroneously. Erroneous order ID: [id number]  Order moved by: Gasper Sells A  Order move date/time: 04/20/2015 10:33 AM  Source Patient: [id number]  Source Contact: 04/20/2015  Destination Patient: [id number]  Destination Contact: 11/02/2012

## 2015-04-20 NOTE — Telephone Encounter (Signed)
Patient was last seen 10/13/14. We have also recently tried to contact this patient to schedule a follow-up but patient has not returned our call. Pharmacy is [facility].

## 2015-04-24 ENCOUNTER — Other Ambulatory Visit: Payer: Self-pay

## 2015-04-24 MED ORDER — LORATADINE 10 MG PO TABS: 10.0000 mg | ORAL_TABLET | Freq: Every day | ORAL | Status: AC | PRN

## 2015-04-24 NOTE — Telephone Encounter (Signed)
Patient has already tried to to be contacted to schedule an appointment. Practice partner number is [phone] and pharmacy is [facility] on [address].

## 2015-04-27 ENCOUNTER — Other Ambulatory Visit: Payer: Self-pay | Admitting: Unknown Physician Specialty

## 2015-04-29 NOTE — Telephone Encounter (Signed)
Debra [name] just approved this; please resolve with pharmacy

## 2015-04-30 NOTE — Telephone Encounter (Signed)
Left patient a voicemail to return my call and schedule a follow-up visit. 

## 2015-05-02 NOTE — Telephone Encounter (Signed)
Called and left patient a voicemail asking for her to return my call and schedule a follow-up visit. 

## 2015-05-03 ENCOUNTER — Telehealth: Payer: Self-pay | Admitting: Unknown Physician Specialty

## 2015-05-03 NOTE — Telephone Encounter (Signed)
PT WOULD LIKE TO HAVE RX FOR PRAVASTATIN CALLED IN TO [facility]

## 2015-05-04 MED ORDER — PRAVASTATIN SODIUM 40 MG PO TABS: 40.0000 mg | ORAL_TABLET | Freq: Every day | ORAL | Status: DC

## 2015-05-04 NOTE — Telephone Encounter (Signed)
I meant that it was shipped on 04/21/15 in the last message.

## 2015-05-04 NOTE — Telephone Encounter (Signed)
Routing to provider  

## 2015-05-04 NOTE — Telephone Encounter (Signed)
Pt called stated she has not received the pravastatin. Please send RX for this medication to [facility] on [address]. Thanks.

## 2015-05-04 NOTE — Telephone Encounter (Signed)
SGPT Feb 2016 okay; Rx sent locally

## 2015-05-04 NOTE — Telephone Encounter (Signed)
Called and left a voicemail for the patient. This medication was sent to [facility] on 04/16/15. So I called [facility] to make sure they got it and they stated that it was shipped on 03/21/15 and delivered on 04/24/15. So I asked for the patient to return my call so I can ask her if she ever got this medication form [facility] and what exactly is going on.

## 2015-05-04 NOTE — Telephone Encounter (Signed)
Called and pharmacy stated that they got the refill that was sent by [facility] on 04/24/15.

## 2015-05-08 DIAGNOSIS — F419 Anxiety disorder, unspecified: Secondary | ICD-10-CM | POA: Insufficient documentation

## 2015-05-08 DIAGNOSIS — M545 Low back pain, unspecified: Secondary | ICD-10-CM | POA: Insufficient documentation

## 2015-05-08 DIAGNOSIS — I493 Ventricular premature depolarization: Secondary | ICD-10-CM | POA: Insufficient documentation

## 2015-05-08 DIAGNOSIS — K219 Gastro-esophageal reflux disease without esophagitis: Secondary | ICD-10-CM | POA: Insufficient documentation

## 2015-05-08 DIAGNOSIS — I10 Essential (primary) hypertension: Secondary | ICD-10-CM

## 2015-05-08 DIAGNOSIS — J309 Allergic rhinitis, unspecified: Secondary | ICD-10-CM

## 2015-05-08 DIAGNOSIS — E876 Hypokalemia: Secondary | ICD-10-CM

## 2015-05-08 DIAGNOSIS — F418 Other specified anxiety disorders: Secondary | ICD-10-CM

## 2015-05-08 DIAGNOSIS — E785 Hyperlipidemia, unspecified: Secondary | ICD-10-CM

## 2015-05-08 DIAGNOSIS — R109 Unspecified abdominal pain: Secondary | ICD-10-CM

## 2015-05-08 DIAGNOSIS — E538 Deficiency of other specified B group vitamins: Secondary | ICD-10-CM | POA: Insufficient documentation

## 2015-05-09 ENCOUNTER — Encounter: Payer: Self-pay | Admitting: Unknown Physician Specialty

## 2015-05-09 ENCOUNTER — Ambulatory Visit (INDEPENDENT_AMBULATORY_CARE_PROVIDER_SITE_OTHER): Payer: Medicare Other | Admitting: Unknown Physician Specialty

## 2015-05-09 VITALS — BP 134/74 | HR 71 | Temp 98.3°F | Ht 58.5 in | Wt 169.4 lb

## 2015-05-09 DIAGNOSIS — I10 Essential (primary) hypertension: Secondary | ICD-10-CM | POA: Diagnosis not present

## 2015-05-09 DIAGNOSIS — E785 Hyperlipidemia, unspecified: Secondary | ICD-10-CM

## 2015-05-09 DIAGNOSIS — F419 Anxiety disorder, unspecified: Secondary | ICD-10-CM | POA: Diagnosis not present

## 2015-05-09 LAB — MICROALBUMIN, URINE WAIVED
Creatinine, Urine Waived: 100 mg/dL (ref 10–300)
Microalb, Ur Waived: 80 mg/L — ABNORMAL HIGH (ref 0–19)

## 2015-05-09 LAB — LIPID PANEL PICCOLO, WAIVED
Chol/HDL Ratio Piccolo,Waive: 2.7 mg/dL
Cholesterol Piccolo, Waived: 157 mg/dL (ref <200)
HDL CHOL PICCOLO, WAIVED: 58 mg/dL (ref >59)
LDL Chol Calc Piccolo Waived: 77 mg/dL (ref <100)
TRIGLYCERIDES PICCOLO,WAIVED: 113 mg/dL (ref <150)
VLDL Chol Calc Piccolo,Waive: 23 mg/dL (ref <30)

## 2015-05-09 MED ORDER — OMEPRAZOLE 20 MG PO CPDR: 20.0000 mg | DELAYED_RELEASE_CAPSULE | Freq: Every day | ORAL | Status: AC

## 2015-05-09 MED ORDER — PRAVASTATIN SODIUM 40 MG PO TABS: 40.0000 mg | ORAL_TABLET | Freq: Every day | ORAL | Status: AC

## 2015-05-09 MED ORDER — FUROSEMIDE 40 MG PO TABS: 40.0000 mg | ORAL_TABLET | Freq: Every day | ORAL | Status: DC

## 2015-05-09 NOTE — Assessment & Plan Note (Signed)
Comprehensive Metabolic Panel ordered results pending Mircoalbumin ordered results pending Uric Acid ordered results pending Continue with Metoprolol, Furosemide, and Amlodipine

## 2015-05-09 NOTE — Assessment & Plan Note (Signed)
Controlled continue taking Fluoxetine (Prozac) 20 mg daily as prescribed

## 2015-05-09 NOTE — Progress Notes (Signed)
BP 134/74 mmHg  Pulse 71  Temp(Src) 98.3 F (36.8 C)  Ht 4' 10.5" (1.486 m)  Wt 169 lb 6.4 oz (76.839 kg)  BMI 34.80 kg/m2  SpO2 94%  LMP  (LMP Unknown)   Subjective:    Patient ID: Debra [name], female    DOB: 21-Mar-1941, 74 y.o.   MRN: [id number]  HPI: [name] is a 74 y.o. female  Chief Complaint  Patient presents with  . Anxiety  . Hyperlipidemia  . Hypertension   Anxiety She states this has improved she has not had any panic attacks in years. She takes her medication as prescribed.  She denies any side effects secondary to medication. Pertinent negatives denies confusion or feeling nervous/anxious  Hyperlipidemia This is a chronic problem medication compliance is excellent.  She is satisfied with the current treatment.  She denies having any medication side effects.  Pertinent negatives denies chest pain, palpitations, or myalgias  Hypertension This is a chronic problem medication compliance is excellent.  She is satisfied with current treatment.  She denies having any side effects with medication use.  Pertinent negative denies chest pain, palpitations, shortness of breath, edema, or dizziness  Relevant past medical, surgical, family and social history reviewed and updated as indicated. Interim medical history since our last visit reviewed. Allergies and medications reviewed and updated.  Review of Systems  Constitutional: Negative for fever, chills, appetite change, fatigue and unexpected weight change.  Respiratory: Negative for cough, chest tightness, shortness of breath and wheezing.   Cardiovascular: Negative for chest pain, palpitations and leg swelling.  Musculoskeletal: Positive for back pain and gait problem.  Neurological: Negative for dizziness, tremors, syncope, weakness, light-headedness, numbness and headaches.  Psychiatric/Behavioral: Negative for suicidal ideas, hallucinations, confusion, sleep disturbance, self-injury and agitation. The  patient is not nervous/anxious.     Per HPI unless specifically indicated above     Objective:    BP 134/74 mmHg  Pulse 71  Temp(Src) 98.3 F (36.8 C)  Ht 4' 10.5" (1.486 m)  Wt 169 lb 6.4 oz (76.839 kg)  BMI 34.80 kg/m2  SpO2 94%  LMP  (LMP Unknown)  Wt Readings from Last 3 Encounters:  05/09/15 169 lb 6.4 oz (76.839 kg)  10/13/14 171 lb (77.565 kg)  04/23/10 186 lb (84.369 kg)    Physical Exam  Constitutional: She is oriented to person, place, and time. She appears well-developed and well-nourished. No distress.  HENT:  Head: Normocephalic and atraumatic.  Right Ear: External ear normal.  Left Ear: External ear normal.  Nose: Nose normal.  Neck: Normal range of motion. Neck supple.  Cardiovascular: Normal rate, regular rhythm and normal heart sounds.   Pulmonary/Chest: Effort normal and breath sounds normal. No respiratory distress. She has no wheezes.  Musculoskeletal: She exhibits no edema or tenderness.  Uses a walker during ambulation  Neurological: She is alert and oriented to person, place, and time. She has normal reflexes.  Skin: Skin is warm and dry. She is not diaphoretic.  Psychiatric: She has a normal mood and affect. Her behavior is normal. Judgment and thought content normal.    Results for orders placed or performed in visit on 05/08/15  HM MAMMOGRAPHY  Result Value Ref Range   HM Mammogram from PP   HM COLONOSCOPY  Result Value Ref Range   HM Colonoscopy from PP       Assessment & Plan:   Problem List Items Addressed This Visit      Unprioritized  Hypertension - Primary   Relevant Medications   furosemide (LASIX) 40 MG tablet   pravastatin (PRAVACHOL) 40 MG tablet   Other Relevant Orders   Lipid Panel Piccolo, Waived   Uric acid   Microalbumin, Urine Waived   Comprehensive metabolic panel   Hyperlipidemia    Lipid panel ordered results       Relevant Medications   furosemide (LASIX) 40 MG tablet   pravastatin (PRAVACHOL) 40 MG  tablet   Other Relevant Orders   Lipid Panel Piccolo, Waived   Acute anxiety    Controlled continue taking Fluoxetine (Prozac) 20 mg daily as prescribed          Follow up plan: Return in about 6 months (around 11/06/2015) for CPE.

## 2015-05-09 NOTE — Assessment & Plan Note (Signed)
Lipid panel ordered results

## 2015-05-10 ENCOUNTER — Encounter: Payer: Self-pay | Admitting: Unknown Physician Specialty

## 2015-05-10 LAB — COMPREHENSIVE METABOLIC PANEL
A/G RATIO: 1.7 (ref 1.1–2.5)
ALK PHOS: 88 IU/L (ref 39–117)
ALT: 11 IU/L (ref 0–32)
AST: 16 IU/L (ref 0–40)
Albumin: 4 g/dL (ref 3.5–4.8)
BILIRUBIN TOTAL: 0.3 mg/dL (ref 0.0–1.2)
BUN / CREAT RATIO: 16 (ref 11–26)
BUN: 9 mg/dL (ref 8–27)
CHLORIDE: 103 mmol/L (ref 97–108)
CO2: 25 mmol/L (ref 18–29)
Calcium: 9 mg/dL (ref 8.7–10.3)
Creatinine, Ser: 0.57 mg/dL (ref 0.57–1.00)
GFR calc Af Amer: 107 mL/min/{1.73_m2} (ref >59)
GFR calc non Af Amer: 93 mL/min/{1.73_m2} (ref >59)
GLOBULIN, TOTAL: 2.3 g/dL (ref 1.5–4.5)
Glucose: 88 mg/dL (ref 65–99)
POTASSIUM: 4.4 mmol/L (ref 3.5–5.2)
SODIUM: 142 mmol/L (ref 134–144)
Total Protein: 6.3 g/dL (ref 6.0–8.5)

## 2015-05-10 LAB — URIC ACID: Uric Acid: 3.7 mg/dL (ref 2.5–7.1)

## 2015-05-11 ENCOUNTER — Telehealth: Payer: Self-pay

## 2015-05-11 MED ORDER — FUROSEMIDE 40 MG PO TABS: 20.0000 mg | ORAL_TABLET | Freq: Every day | ORAL | Status: AC

## 2015-05-11 NOTE — Telephone Encounter (Signed)
Pharmacy sent a fax stating that the Furosemide has two different directions on it and they need clarification as to which one it is.

## 2015-05-11 NOTE — Telephone Encounter (Signed)
I should read 40 mg, take 1/2 daily.  I will send new rx

## 2015-06-29 ENCOUNTER — Other Ambulatory Visit: Payer: Self-pay | Admitting: Unknown Physician Specialty

## 2015-07-04 ENCOUNTER — Other Ambulatory Visit: Payer: Self-pay | Admitting: Unknown Physician Specialty

## 2015-07-27 ENCOUNTER — Other Ambulatory Visit: Payer: Self-pay | Admitting: Unknown Physician Specialty

## 2015-07-27 MED ORDER — METOPROLOL SUCCINATE ER 100 MG PO TB24: 100.0000 mg | ORAL_TABLET | Freq: Two times a day (BID) | ORAL | Status: AC

## 2015-07-27 MED ORDER — METOPROLOL SUCCINATE ER 50 MG PO TB24: 50.0000 mg | ORAL_TABLET | Freq: Every day | ORAL | Status: DC

## 2015-07-27 MED ORDER — METOPROLOL SUCCINATE ER 100 MG PO TB24: 100.0000 mg | ORAL_TABLET | Freq: Two times a day (BID) | ORAL | Status: DC

## 2015-07-27 NOTE — Telephone Encounter (Signed)
Patient was seen 05/09/15 and has appointment scheduled for 11/16/15. [facility] and then more medication sent to [facility] to get her to her appointment in March.

## 2015-07-27 NOTE — Telephone Encounter (Signed)
Left patient a message letting her know that her medications were refilled as requested.

## 2015-07-27 NOTE — Telephone Encounter (Signed)
Pt called stated she is out of her Metoprolol. Needs enough to last until her mail order comes in (7-10 days). [facility] [address]. Thanks.

## 2015-08-06 ENCOUNTER — Other Ambulatory Visit: Payer: Self-pay | Admitting: Unknown Physician Specialty

## 2015-09-14 ENCOUNTER — Other Ambulatory Visit: Payer: Self-pay | Admitting: Unknown Physician Specialty

## 2015-10-11 ENCOUNTER — Inpatient Hospital Stay
Admission: EM | Admit: 2015-10-11 | Discharge: 2015-10-17 | DRG: 296 | Disposition: E | Payer: Medicare Other | Attending: Internal Medicine | Admitting: Internal Medicine

## 2015-10-11 ENCOUNTER — Emergency Department: Payer: Medicare Other

## 2015-10-11 DIAGNOSIS — I959 Hypotension, unspecified: Secondary | ICD-10-CM | POA: Diagnosis present

## 2015-10-11 DIAGNOSIS — Z882 Allergy status to sulfonamides status: Secondary | ICD-10-CM | POA: Diagnosis not present

## 2015-10-11 DIAGNOSIS — K219 Gastro-esophageal reflux disease without esophagitis: Secondary | ICD-10-CM | POA: Diagnosis not present

## 2015-10-11 DIAGNOSIS — R509 Fever, unspecified: Secondary | ICD-10-CM | POA: Diagnosis not present

## 2015-10-11 DIAGNOSIS — R197 Diarrhea, unspecified: Secondary | ICD-10-CM | POA: Diagnosis present

## 2015-10-11 DIAGNOSIS — J189 Pneumonia, unspecified organism: Secondary | ICD-10-CM

## 2015-10-11 DIAGNOSIS — Z8249 Family history of ischemic heart disease and other diseases of the circulatory system: Secondary | ICD-10-CM | POA: Diagnosis not present

## 2015-10-11 DIAGNOSIS — I469 Cardiac arrest, cause unspecified: Secondary | ICD-10-CM | POA: Diagnosis not present

## 2015-10-11 DIAGNOSIS — A419 Sepsis, unspecified organism: Secondary | ICD-10-CM | POA: Diagnosis not present

## 2015-10-11 DIAGNOSIS — R531 Weakness: Secondary | ICD-10-CM | POA: Diagnosis not present

## 2015-10-11 DIAGNOSIS — E538 Deficiency of other specified B group vitamins: Secondary | ICD-10-CM | POA: Diagnosis not present

## 2015-10-11 DIAGNOSIS — E785 Hyperlipidemia, unspecified: Secondary | ICD-10-CM | POA: Diagnosis present

## 2015-10-11 DIAGNOSIS — A09 Infectious gastroenteritis and colitis, unspecified: Secondary | ICD-10-CM | POA: Diagnosis not present

## 2015-10-11 DIAGNOSIS — Z823 Family history of stroke: Secondary | ICD-10-CM | POA: Diagnosis not present

## 2015-10-11 DIAGNOSIS — E876 Hypokalemia: Secondary | ICD-10-CM | POA: Diagnosis present

## 2015-10-11 DIAGNOSIS — E861 Hypovolemia: Secondary | ICD-10-CM | POA: Diagnosis present

## 2015-10-11 DIAGNOSIS — R112 Nausea with vomiting, unspecified: Secondary | ICD-10-CM | POA: Diagnosis present

## 2015-10-11 DIAGNOSIS — Z743 Need for continuous supervision: Secondary | ICD-10-CM | POA: Diagnosis not present

## 2015-10-11 DIAGNOSIS — Z7982 Long term (current) use of aspirin: Secondary | ICD-10-CM | POA: Diagnosis not present

## 2015-10-11 DIAGNOSIS — E860 Dehydration: Secondary | ICD-10-CM | POA: Diagnosis present

## 2015-10-11 DIAGNOSIS — I10 Essential (primary) hypertension: Secondary | ICD-10-CM | POA: Diagnosis present

## 2015-10-11 DIAGNOSIS — Z4682 Encounter for fitting and adjustment of non-vascular catheter: Secondary | ICD-10-CM | POA: Diagnosis not present

## 2015-10-11 DIAGNOSIS — R0989 Other specified symptoms and signs involving the circulatory and respiratory systems: Secondary | ICD-10-CM | POA: Diagnosis not present

## 2015-10-11 DIAGNOSIS — I4891 Unspecified atrial fibrillation: Secondary | ICD-10-CM | POA: Diagnosis present

## 2015-10-11 DIAGNOSIS — J9602 Acute respiratory failure with hypercapnia: Secondary | ICD-10-CM | POA: Diagnosis present

## 2015-10-11 LAB — BLOOD GAS, ARTERIAL
ACID-BASE DEFICIT: 12.8 mmol/L — AB (ref 0.0–2.0)
ALLENS TEST (PASS/FAIL): POSITIVE — AB
Acid-base deficit: 18 mmol/L — ABNORMAL HIGH (ref 0.0–2.0)
Bicarbonate: 16.6 mEq/L — ABNORMAL LOW (ref 21.0–28.0)
Bicarbonate: 13.7 mEq/L — ABNORMAL LOW (ref 21.0–28.0)
FIO2: 1
FIO2: 1
MECHANICAL RATE: 16
MECHVT: 450 mL
O2 SAT: 97.1 %
O2 Saturation: 97.8 %
PCO2 ART: 56 mmHg — AB (ref 32.0–48.0)
PEEP: 5 cmH2O
Patient temperature: 37
Patient temperature: 37
RATE: 16 resp/min
pCO2 arterial: 57 mmHg — ABNORMAL HIGH (ref 32.0–48.0)
pH, Arterial: 7.08 — CL (ref 7.350–7.450)
pH, Arterial: 6.99 — CL (ref 7.350–7.450)
pO2, Arterial: 123 mmHg — ABNORMAL HIGH (ref 83.0–108.0)
pO2, Arterial: 145 mmHg — ABNORMAL HIGH (ref 83.0–108.0)

## 2015-10-11 LAB — COMPREHENSIVE METABOLIC PANEL
ALT: 68 U/L — ABNORMAL HIGH (ref 14–54)
AST: 159 U/L — AB (ref 15–41)
Albumin: 2.2 g/dL — ABNORMAL LOW (ref 3.5–5.0)
Alkaline Phosphatase: 75 U/L (ref 38–126)
Anion gap: 16 — ABNORMAL HIGH (ref 5–15)
BUN: 19 mg/dL (ref 6–20)
CHLORIDE: 112 mmol/L — AB (ref 101–111)
CO2: 16 mmol/L — ABNORMAL LOW (ref 22–32)
Calcium: 7 mg/dL — ABNORMAL LOW (ref 8.9–10.3)
Creatinine, Ser: 1.92 mg/dL — ABNORMAL HIGH (ref 0.44–1.00)
GFR, EST AFRICAN AMERICAN: 29 mL/min — AB (ref >60)
GFR, EST NON AFRICAN AMERICAN: 25 mL/min — AB (ref >60)
Glucose, Bld: 158 mg/dL — ABNORMAL HIGH (ref 65–99)
POTASSIUM: 2.1 mmol/L — AB (ref 3.5–5.1)
SODIUM: 144 mmol/L (ref 135–145)
Total Bilirubin: 0.8 mg/dL (ref 0.3–1.2)
Total Protein: 4.1 g/dL — ABNORMAL LOW (ref 6.5–8.1)

## 2015-10-11 LAB — CBC WITH DIFFERENTIAL/PLATELET
BASOS ABS: 0 10*3/uL (ref 0–0.1)
EOS ABS: 0 10*3/uL (ref 0–0.7)
Eosinophils Relative: 0 %
HCT: 26.4 % — ABNORMAL LOW (ref 35.0–47.0)
HEMOGLOBIN: 7.3 g/dL — AB (ref 12.0–16.0)
Lymphocytes Relative: 10 %
Lymphs Abs: 0.8 10*3/uL — ABNORMAL LOW (ref 1.0–3.6)
MCH: 20.4 pg — AB (ref 26.0–34.0)
MCHC: 27.5 g/dL — ABNORMAL LOW (ref 32.0–36.0)
MCV: 74.1 fL — ABNORMAL LOW (ref 80.0–100.0)
Monocytes Absolute: 0.2 10*3/uL (ref 0.2–0.9)
Monocytes Relative: 3 %
NEUTROS ABS: 7.2 10*3/uL — AB (ref 1.4–6.5)
PLATELETS: 131 10*3/uL — AB (ref 150–440)
RBC: 3.56 MIL/uL — AB (ref 3.80–5.20)
RDW: 16.9 % — AB (ref 11.5–14.5)
WBC: 8.3 10*3/uL (ref 3.6–11.0)

## 2015-10-11 LAB — GLUCOSE, CAPILLARY: Glucose-Capillary: 73 mg/dL (ref 65–99)

## 2015-10-11 LAB — LIPASE, BLOOD: LIPASE: 17 U/L (ref 11–51)

## 2015-10-11 LAB — PROTIME-INR
INR: 2.8
PROTHROMBIN TIME: 29.1 s — AB (ref 11.4–15.0)

## 2015-10-11 LAB — LACTIC ACID, PLASMA: Lactic Acid, Venous: 11.2 mmol/L (ref 0.5–2.0)

## 2015-10-11 LAB — TROPONIN I: TROPONIN I: 3.59 ng/mL — AB (ref <0.031)

## 2015-10-11 MED ORDER — NOREPINEPHRINE BITARTRATE 1 MG/ML IV SOLN
0.0000 ug/min | Freq: Once | INTRAVENOUS | Status: AC
  Administered 2015-10-11: 30 ug/min via INTRAVENOUS

## 2015-10-11 MED ORDER — EPINEPHRINE HCL 0.1 MG/ML IJ SOSY
PREFILLED_SYRINGE | INTRAMUSCULAR | Status: AC
  Filled 2015-10-11: qty 20

## 2015-10-11 MED ORDER — SODIUM CHLORIDE 0.9 % IV BOLUS (SEPSIS): 1000.0000 mL | INTRAVENOUS | Status: AC

## 2015-10-11 MED ORDER — NOREPINEPHRINE BITARTRATE 1 MG/ML IV SOLN
30.0000 ug/min | Freq: Once | INTRAVENOUS | Status: AC
  Administered 2015-10-11: 30 ug/min via INTRAVENOUS
  Filled 2015-10-11: qty 4

## 2015-10-11 MED ORDER — SODIUM CHLORIDE 0.9 % IV BOLUS (SEPSIS): 500.0000 mL | INTRAVENOUS | Status: DC

## 2015-10-11 MED ORDER — VANCOMYCIN HCL IN DEXTROSE 1-5 GM/200ML-% IV SOLN: 1000.0000 mg | Freq: Once | INTRAVENOUS | Status: DC

## 2015-10-11 MED ORDER — NOREPINEPHRINE BITARTRATE 1 MG/ML IV SOLN
30.0000 ug/min | INTRAVENOUS | Status: DC
  Administered 2015-10-11: 30 ug/min via INTRAVENOUS
  Filled 2015-10-11: qty 4

## 2015-10-11 MED ORDER — KCL IN DEXTROSE-NACL 20-5-0.45 MEQ/L-%-% IV SOLN: Freq: Once | INTRAVENOUS | Status: DC

## 2015-10-11 MED ORDER — MAGNESIUM SULFATE 2 GM/50ML IV SOLN: 2.0000 g | Freq: Once | INTRAVENOUS | Status: DC

## 2015-10-11 MED ORDER — SODIUM BICARBONATE 8.4 % IV SOLN
100.0000 meq | Freq: Once | INTRAVENOUS | Status: AC
  Administered 2015-10-11: 50 meq via INTRAVENOUS
  Filled 2015-10-11: qty 100

## 2015-10-11 MED ORDER — SODIUM BICARBONATE 8.4 % IV SOLN
INTRAVENOUS | Status: AC
  Administered 2015-10-11: 50 meq via INTRAVENOUS
  Filled 2015-10-11: qty 50

## 2015-10-11 MED ORDER — SODIUM CHLORIDE 0.9 % IV BOLUS (SEPSIS)
1000.0000 mL | INTRAVENOUS | Status: AC
  Administered 2015-10-11 (×2): 1000 mL via INTRAVENOUS

## 2015-10-11 MED ORDER — SODIUM BICARBONATE 8.4 % IV SOLN
50.0000 meq | Freq: Once | INTRAVENOUS | Status: AC
  Administered 2015-10-11: 50 meq via INTRAVENOUS

## 2015-10-11 MED ORDER — EPINEPHRINE HCL 0.1 MG/ML IJ SOSY
PREFILLED_SYRINGE | INTRAMUSCULAR | Status: AC | PRN
  Administered 2015-10-11 (×6): 1 via INTRAVENOUS

## 2015-10-11 MED ORDER — DOPAMINE-DEXTROSE 3.2-5 MG/ML-% IV SOLN
30.0000 ug/kg/min | Freq: Once | INTRAVENOUS | Status: AC
  Administered 2015-10-11: 30 ug/kg/min via INTRAVENOUS

## 2015-10-11 MED ORDER — CALCIUM GLUCONATE 10 % IV SOLN
1.0000 g | Freq: Once | INTRAVENOUS | Status: AC
  Administered 2015-10-11: 1 g via INTRAVENOUS

## 2015-10-11 MED ORDER — NOREPINEPHRINE BITARTRATE 1 MG/ML IV SOLN
8.0000 ug/min | Freq: Once | INTRAVENOUS | Status: AC
  Administered 2015-10-11: 8 ug/min via INTRAVENOUS
  Filled 2015-10-11: qty 4

## 2015-10-11 MED ORDER — POTASSIUM CHLORIDE 10 MEQ/100ML IV SOLN
10.0000 meq | INTRAVENOUS | Status: DC
  Administered 2015-10-11: 10 meq via INTRAVENOUS
  Filled 2015-10-11 (×4): qty 100

## 2015-10-11 MED ORDER — DEXTROSE 5 % IV SOLN
0.5000 ug/min | INTRAVENOUS | Status: DC
  Filled 2015-10-11: qty 4

## 2015-10-11 MED ORDER — POTASSIUM CHLORIDE 10 MEQ/100ML IV SOLN
10.0000 meq | INTRAVENOUS | Status: DC
  Filled 2015-10-11 (×6): qty 100

## 2015-10-11 MED ORDER — SODIUM CHLORIDE 0.9 % IV BOLUS (SEPSIS)
1000.0000 mL | Freq: Once | INTRAVENOUS | Status: AC
  Administered 2015-10-11: 1000 mL via INTRAVENOUS

## 2015-10-11 MED ORDER — SODIUM CHLORIDE 0.9 % IV SOLN
1.0000 g | Freq: Once | INTRAVENOUS | Status: DC
  Filled 2015-10-11: qty 10

## 2015-10-11 MED ORDER — DEXTROSE 5 % IV SOLN
2.0000 g | Freq: Once | INTRAVENOUS | Status: AC
  Administered 2015-10-11: 2 g via INTRAVENOUS
  Filled 2015-10-11: qty 2

## 2015-10-11 MED ORDER — DEXTROSE 5 % IV SOLN
0.0000 ug/min | INTRAVENOUS | Status: DC
  Administered 2015-10-11: 20 ug/min via INTRAVENOUS
  Administered 2015-10-11: 40 ug/min via INTRAVENOUS
  Filled 2015-10-11: qty 1

## 2015-10-13 MED FILL — Medication: Qty: 1 | Status: AC

## 2015-10-16 LAB — CULTURE, BLOOD (ROUTINE X 2)
CULTURE: NO GROWTH
Culture: NO GROWTH

## 2015-10-17 NOTE — ED Notes (Signed)
1st dose of atropine given; CPR continues.

## 2015-10-17 NOTE — Progress Notes (Addendum)
Patient is admitted for acute respiratory failure status post cardiac arrest. She was noted to be in PEA.  Patient again had PE a.m. was coded for over 20 minutes without regain of pulses.I was present during CODE I spoke with patient's family along with Dr. Dolores Frame, ER physician. Family decided to discontinue CPR. Patient was pronounced at 8:30.  CRITICAL CARE TOTAL TIME 45 minutes

## 2015-10-17 NOTE — ED Notes (Signed)
2nd dose of Epi given

## 2015-10-17 NOTE — ED Notes (Signed)
Pt's HR dropped to 45 BPM; CPR resumed at this time.

## 2015-10-17 NOTE — ED Notes (Signed)
3rd L of 0/9% NS started and infusing at this time.

## 2015-10-17 NOTE — Progress Notes (Signed)
Pastoral Care and prayer provided for family. °

## 2015-10-17 NOTE — ED Notes (Signed)
1st L of 0.9% NS started and infusing at this time.

## 2015-10-17 NOTE — ED Notes (Signed)
1st dose of Epi given

## 2015-10-17 NOTE — ED Provider Notes (Signed)
-----------------------------------------   8:34 AM on 30-Oct-2015 -----------------------------------------  Addendum: Patient lost pulses again and was coded for over 20 minutes without regain of spontaneous circulation. Pupils fixed and dilated. Dr. Juliene Pina at bedside and also present to speak with family who decided to discontinue CPR. Time of death 0830.  Irean Hong, MD 2015-10-30 [phone]

## 2015-10-17 NOTE — ED Provider Notes (Signed)
-----------------------------------------   4:25 AM on 2015/11/08 -----------------------------------------  Left femoral triple-lumen catheter placed. CENTRAL LINE Performed by: Minna Antis Consent: The procedure was performed in an emergent situation. Required items: required blood products, implants, devices, and special equipment available Patient identity confirmed: arm band and provided demographic data Time out: Emergent procedure  Indications: vascular access Patient sedated: Yes, intubated, status postcardiac resuscitation  Preparation: skin prepped with 2% chlorhexidine Skin prep agent dried: skin prep agent completely dried prior to procedure Sterile barriers: cap, mask, sterile gloves Hand hygiene: hand hygiene performed prior to central venous catheter insertion  Location details: Left femoral vein   Catheter type: triple lumen Catheter size: 8 Fr Pre-procedure: landmarks identified Ultrasound guidance: Yes  Successful placement: yes Post-procedure: line sutured and dressing applied Assessment: blood return through all parts, free fluid flow Patient tolerance: Patient tolerated the procedure well with no immediate complications.   Minna Antis, MD Nov 08, 2015 [phone]

## 2015-10-17 NOTE — Code Documentation (Signed)
Patient is in PEA at this rhythm check.  Dr. Juliene Pina updating family at this time.

## 2015-10-17 NOTE — Discharge Summary (Signed)
This is 75 year old female who presented to emergency room with vomiting and diarrhea. Shortly after arrival into the emergency room she was unresponsive and had PEA cardiac arrest. CPR was immediately initiated. She was intubated by respiratory therapy to protect airway. She had a large amount of emesis prior to intubation. After intubation there was blood in the ET tube. She was placed on 3 pressors for hypotension. Following return of perfusion hospital service was consulted for admission. While in the emergency room she had another PEA arrest. CPR was performed for over 20 minutes. Family had decided to discontinue CPR. Patient was pronounced at 8:30 AM.  1. Cardiac arrest: 2. Acute hypercapnic respiratory failure 3. Severe hypokalemia 4. Elevated troponin 5. Diarrhea  TIME 30 minutes

## 2015-10-17 NOTE — ED Notes (Signed)
1st dose of D50 given.

## 2015-10-17 NOTE — ED Notes (Addendum)
Called [facility] at this time.  Ruled out for all donation.  Referral Number:  [id number] Spoke to [facility]  Free to go to funeral home when family is ready.

## 2015-10-17 NOTE — ED Notes (Signed)
CPR paused for HR and heart rhythm check. Pt noted to have (+) pulse. CPR terminated at this time.

## 2015-10-17 NOTE — ED Notes (Signed)
Pt noted to have no pulse; CPR resumed at this time.

## 2015-10-17 NOTE — ED Notes (Signed)
1st dose of Bicarb given.

## 2015-10-17 NOTE — ED Notes (Signed)
Pt arrives to ED via [facility] from home d/t c/o N/V/D and flu-like s/x's since Monday. Per EMS, pt and family reports several episodes of emesis and diarrhea. EMS reported VS: HR 124, BP 90/30, oral Temp of 97.9. Pt arrives alert, moaning, and complaining of SHOB d/t mask in place. During triage assessment and VS check, pt placed on 2L O2 via [place] for O2 sats in high 70's. Pt then became unresponsive at that time, MD paged and Code Blue activated.

## 2015-10-17 NOTE — ED Notes (Signed)
2nd amp of Bicarb given IVP at this time.

## 2015-10-17 NOTE — ED Notes (Addendum)
2nd L of 0.9% NS started and infusing at this time; CPR continues.

## 2015-10-17 NOTE — H&P (Signed)
Debra [name] is an 75 y.o. female.   Chief Complaint: Vomiting and diarrhea HPI: The patient presents emergency department complaining of nausea, vomiting and diarrhea. The emergency department staff reports that the patient admitted to feeling unwell for the last few days. Notably, she has been at her husband's bedside who had been hospitalized for approximately one month and may have had C. difficile colitis. Upon arrival in the emergency department the patient has barely been rolled into her room when she lost a pulse and CPR was initiated. She did not require cardioversion. Perfusing rhythm was lost 3 times until pressors were initiated. She is currently intubated and on Levophed, dopamine and Neo-Synephrine. Following return of perfusion the emergency department staff called for admission.  Past Medical History  Diagnosis Date  . PVC's (premature ventricular contractions)   . GERD (gastroesophageal reflux disease)   . Abdominal pain   . Vitamin B12 deficiency   . Lumbago   . Hypokalemia   . Hyperlipidemia   . Hypertension   . Anxiety   . Allergy     Past Surgical History  Procedure Laterality Date  . Cholecystectomy    . Breast surgery    . Abdominal hysterectomy      05/31/02  . Appendectomy    . Ankle surgery      x2  . Wrist surgery Left     Family History  Problem Relation Age of Onset  . Hypertension Mother   . Stroke Mother   . Cancer Father     gallbladder  . Heart disease Brother    Social History:  reports that she has never smoked. She has never used smokeless tobacco. She reports that she does not drink alcohol or use illicit drugs.  Allergies:  Allergies  Allergen Reactions  . Latex   . Lotensin [Benazepril Hcl] Cough  . Sulfa Antibiotics     REACTION: rash  . Sulfonamide Derivatives     REACTION: rash     (Not in a hospital admission)  Results for orders placed or performed during the hospital encounter of 11/07/15 (from the past 48 hour(s))   Blood gas, arterial ([facility] & [facility] ONLY)     Status: Abnormal   Collection Time: 11/07/2015  3:37 AM  Result Value Ref Range   FIO2 1.00    Delivery systems AMBU BAG    pH, Arterial 7.08 (LL) 7.350 - 7.450    Comment: CRITICAL RESULT, NOTIFIED PHYSICIAN DR SUNG 2015-11-07 0348 [facility]    pCO2 arterial 56 (H) 32.0 - 48.0 mmHg   pO2, Arterial 123 (H) 83.0 - 108.0 mmHg   Bicarbonate 16.6 (L) 21.0 - 28.0 mEq/L   Acid-base deficit 12.8 (H) 0.0 - 2.0 mmol/L   O2 Saturation 97.1 %   Patient temperature 37.0    Collection site FEMORAL ARTERY    Sample type ARTERIAL DRAW    Allens test (pass/fail) NOT APPLICABLE  PASS  CBC with Differential     Status: Abnormal   Collection Time: 11/07/2015  3:47 AM  Result Value Ref Range   WBC 8.3 3.6 - 11.0 K/uL   RBC 3.56 (L) 3.80 - 5.20 MIL/uL   Hemoglobin 7.3 (L) 12.0 - 16.0 g/dL   HCT 26.4 (L) 35.0 - 47.0 %   MCV 74.1 (L) 80.0 - 100.0 fL   MCH 20.4 (L) 26.0 - 34.0 pg   MCHC 27.5 (L) 32.0 - 36.0 g/dL   RDW 16.9 (H) 11.5 - 14.5 %   Platelets 131 (L)  150 - 440 K/uL   Neutrophils Relative % 87% %   Neutro Abs 7.2 (H) 1.4 - 6.5 K/uL   Lymphocytes Relative 10% %   Lymphs Abs 0.8 (L) 1.0 - 3.6 K/uL   Monocytes Relative 3% %   Monocytes Absolute 0.2 0.2 - 0.9 K/uL   Eosinophils Relative 0% %   Eosinophils Absolute 0.0 0 - 0.7 K/uL   Basophils Relative 0% %   Basophils Absolute 0.0 0 - 0.1 K/uL  Comprehensive metabolic panel     Status: Abnormal   Collection Time: 10/29/2015  3:47 AM  Result Value Ref Range   Sodium 144 135 - 145 mmol/L   Potassium 2.1 (LL) 3.5 - 5.1 mmol/L    Comment: CRITICAL RESULT CALLED TO, READ BACK BY AND VERIFIED WITH BUTCH WOODS ON 2/23/217 AT 0518 [facility]    Chloride 112 (H) 101 - 111 mmol/L   CO2 16 (L) 22 - 32 mmol/L   Glucose, Bld 158 (H) 65 - 99 mg/dL   BUN 19 6 - 20 mg/dL   Creatinine, Ser 1.92 (H) 0.44 - 1.00 mg/dL   Calcium 7.0 (L) 8.9 - 10.3 mg/dL   Total Protein 4.1 (L) 6.5 - 8.1 g/dL   Albumin 2.2 (L) 3.5 - 5.0 g/dL   AST  159 (H) 15 - 41 U/L   ALT 68 (H) 14 - 54 U/L    Comment: RESULT CONFIRMED BY MANUAL DILUTION   Alkaline Phosphatase 75 38 - 126 U/L   Total Bilirubin 0.8 0.3 - 1.2 mg/dL   GFR calc non Af Amer 25 (L) >60 mL/min   GFR calc Af Amer 29 (L) >60 mL/min    Comment: (NOTE) The eGFR has been calculated using the CKD EPI equation. This calculation has not been validated in all clinical situations. eGFR's persistently <60 mL/min signify possible Chronic Kidney Disease.    Anion gap 16 (H) 5 - 15  Lipase, blood     Status: None   Collection Time: Oct 29, 2015  3:47 AM  Result Value Ref Range   Lipase 17 11 - 51 U/L  Troponin I     Status: Abnormal   Collection Time: 29-Oct-2015  3:47 AM  Result Value Ref Range   Troponin I 3.59 (HH) <0.031 ng/mL    Comment:        POSSIBLE MYOCARDIAL ISCHEMIA. SERIAL TESTING RECOMMENDED. CRITICAL RESULT CALLED TO, READ BACK BY AND VERIFIED WITH: BUTCH WOODS ON 2015-10-29 0518 [place]   Lactic acid, plasma     Status: Abnormal   Collection Time: 10/29/15  3:47 AM  Result Value Ref Range   Lactic Acid, Venous 11.2 (HH) 0.5 - 2.0 mmol/L    Comment: CRITICAL RESULT CALLED TO, READ BACK BY AND VERIFIED WITH BUTCH WOODS ON October 29, 2015 AT 0534 [facility]   Protime-INR     Status: Abnormal   Collection Time: 2015-10-29  3:47 AM  Result Value Ref Range   Prothrombin Time 29.1 (H) 11.4 - 15.0 seconds   INR 2.80   Blood gas, arterial ([facility] & [facility] ONLY)     Status: Abnormal   Collection Time: 2015/10/29  4:37 AM  Result Value Ref Range   FIO2 1.00    Delivery systems VENTILATOR    Mode ASSIST CONTROL    VT 450 mL   LHR 16 resp/min   Peep/cpap 5.0 cm H20   pH, Arterial 6.99 (LL) 7.350 - 7.450    Comment: CRITICAL RESULT, NOTIFIED PHYSICIAN DR SUNG 10/29/2015 0447 [facility]  pCO2 arterial 57 (H) 32.0 - 48.0 mmHg   pO2, Arterial 145 (H) 83.0 - 108.0 mmHg   Bicarbonate 13.7 (L) 21.0 - 28.0 mEq/L   Acid-base deficit 18.0 (H) 0.0 - 2.0 mmol/L   O2 Saturation 97.8 %   Patient temperature 37.0     Collection site RIGHT RADIAL    Sample type ARTERIAL DRAW    Allens test (pass/fail) POSITIVE (A) PASS   Mechanical Rate 16    Dg Chest Portable 1 View  2015/11/09  CLINICAL DATA:  Initial evaluation for code fluid, intubation. EXAM: PORTABLE CHEST 1 VIEW COMPARISON:  None. FINDINGS: Patient is slightly rotated to the right. Endotracheal tube in place with tip approximately 3.6 cm above the carina. Cardiac silhouette within normal limits. Mediastinal silhouette grossly normal. The for motor pad overlies left chest. Patchy opacity within the right upper lobe, which may reflect infiltrate or even possibly aspiration. No other focal airspace disease. No pulmonary edema or pleural effusion. No pneumothorax. No acute osseus abnormality. IMPRESSION: 1. Tip of the endotracheal tube approximately 3.6 cm above the carina. 2. Patchy right upper lobe opacity. While this finding may in part be related to patient rotation, possible infiltrate or even aspiration could be considered. Electronically Signed   By: Jeannine Boga M.D.   On: Nov 09, 2015 04:59    Review of Systems  Unable to perform ROS: intubated    Blood pressure 66/51, pulse 105, temperature 97.9 F (36.6 C), temperature source Oral, resp. rate 20, height _0  (1.575 m), weight 79.379 kg (175 lb), SpO2 98 %. Physical Exam  Vitals reviewed. Constitutional: She is oriented to person, place, and time. She appears well-developed and well-nourished. She appears distressed.  HENT:  Head: Normocephalic and atraumatic.  Mouth/Throat: Oropharynx is clear and moist.  Eyes: Conjunctivae and EOM are normal. Pupils are equal, round, and reactive to light. No scleral icterus.  Neck: Neck supple. No JVD present. No tracheal deviation present. No thyromegaly present.  Cardiovascular: Normal rate, regular rhythm and normal heart sounds.  Exam reveals no gallop and no friction rub.   No murmur heard. Respiratory: Effort normal and breath sounds  normal.  Endotracheal tube in place  GI: Soft. Bowel sounds are normal. She exhibits no distension. There is no tenderness.  Genitourinary:  Deferred  Musculoskeletal:  Patient is unresponsive  Lymphadenopathy:    She has no cervical adenopathy.  Neurological: She is alert and oriented to person, place, and time. No cranial nerve deficit. She exhibits normal muscle tone.  Skin: Skin is warm and dry.  Psychiatric:  Patient is unresponsive     Assessment/Plan This is a 75 year old female admitted for cardiac arrest and respiratory failure. 1. Cardiac arrest: Likely secondary to hypovolemia and sepsis. The patient had multiple episodes of large volume emesis as well as reported diarrhea. She is hypotensive and on pressor therapy. Intra-line is in place. We will continue to volume resuscitate. She is on broad-spectrum antibiotics to cover sepsis. 2. Respiratory failure: with hypercapnia; intubated and on full ventilator support. There is a patchy opacity in the right upper lobe which could represent aspiration pneumonia given the patient's history of vomiting. Cover healthcare associated pneumonia. 3. Hypokalemia: Replete potassium  4. Diarrhea: Presumably infectious origin. The patient may have been exposed to Norovirus. Enteric precautions.  5. DVT prophylaxis: Heparin 6. GI prophylaxis: PPI due to large volume emesis and critical illness The patient is a full code. I had a long family discussion during which they have decided to continue  full support until more family members can arrive. Time spent on admission orders and critical patient care approximately 55 minutes   Harrie Foreman, MD 10-18-2015, 7:05 AM

## 2015-10-17 NOTE — ED Notes (Signed)
Dopamine drip started and infusing at this time. IV drip set at .

## 2015-10-17 NOTE — ED Notes (Signed)
CPR paused for HR and heart rhythm check. (+) HR and pulse, 113/66 BP. CPR discontinued at this time.

## 2015-10-17 NOTE — ED Provider Notes (Signed)
[facility] Emergency Department Provider Note ____________________________________________  Time seen: Approximately 3:46 AM  I have reviewed the triage vital signs and the nursing notes.   HISTORY  Chief Complaint Emesis and Diarrhea    HPI Debra [name] is a 75 y.o. female brought to the ED from home via EMS with a chief complaint offlulike symptoms, nausea, vomiting, diarrhea. Patient has spent the past 31 days in the hospital with her dying spouse who passed 4 days ago. Patient has had 2 days of flulike symptoms including nausea, vomiting, diarrhea coughing with bodyaches. Family finally convinced patient to come to the ED for evaluation. Within 3 minutes of her arrival in the ED patient became unresponsive and pulseless.   Past Medical History  Diagnosis Date  . PVC's (premature ventricular contractions)   . GERD (gastroesophageal reflux disease)   . Abdominal pain   . Vitamin B12 deficiency   . Lumbago   . Hypokalemia   . Hyperlipidemia   . Hypertension   . Anxiety   . Allergy     Patient Active Problem List   Diagnosis Date Noted  . PVC's (premature ventricular contractions) 05/08/2015  . Acid reflux 05/08/2015  . Abdominal pain 05/08/2015  . Vitamin B12 deficiency 05/08/2015  . Lumbago 05/08/2015  . Hypokalemia 05/08/2015  . Hypertension 05/08/2015  . Hyperlipidemia 05/08/2015  . Acute anxiety 05/08/2015  . Allergic rhinitis 05/08/2015  . Depression with anxiety 05/08/2015  . DIVERTICULOSIS OF COLON 04/11/2010    Past Surgical History  Procedure Laterality Date  . Cholecystectomy    . Breast surgery    . Abdominal hysterectomy      05/31/02  . Appendectomy    . Ankle surgery      x2  . Wrist surgery Left     Current Outpatient Rx  Name  Route  Sig  Dispense  Refill  . amLODipine (NORVASC) 5 MG tablet   Oral   Take 1 tablet (5 mg total) by mouth daily.   90 tablet   1   . aspirin 81 MG tablet   Oral   Take 81 mg  by mouth daily.         . Cholecalciferol (VITAMIN D-3) 1000 UNITS CAPS   Oral   Take 1,000 Units by mouth daily.         Marland Kitchen FLUoxetine (PROZAC) 20 MG capsule   Oral   Take 1 capsule (20 mg total) by mouth daily.   90 capsule   1   . fluticasone (FLONASE) 50 MCG/ACT nasal spray   Each Nare   Place 2 sprays into both nostrils daily.   90 g   0   . furosemide (LASIX) 40 MG tablet   Oral   Take 0.5 tablets (20 mg total) by mouth daily.   45 tablet   3   . lisinopril (PRINIVIL,ZESTRIL) 20 MG tablet      Take 1 tablet by mouth  daily   90 tablet   1   . loratadine (CLARITIN) 10 MG tablet   Oral   Take 1 tablet (10 mg total) by mouth daily as needed for allergies.   90 tablet   3   . metoprolol succinate (TOPROL-XL) 100 MG 24 hr tablet   Oral   Take 1 tablet (100 mg total) by mouth 2 (two) times daily. Take with or immediately following a meal.   60 tablet   0   . metoprolol succinate (TOPROL-XL) 100 MG 24 hr  tablet   Oral   Take 1 tablet (100 mg total) by mouth daily.   180 tablet   1   . omeprazole (PRILOSEC) 20 MG capsule   Oral   Take 1 capsule (20 mg total) by mouth daily.   90 capsule   3   . potassium chloride SA (K-DUR,KLOR-CON) 20 MEQ tablet      Take 1 tablet by mouth  daily   90 tablet   1   . pravastatin (PRAVACHOL) 40 MG tablet   Oral   Take 1 tablet (40 mg total) by mouth daily.   90 tablet   3     Allergies Latex; Lotensin; Sulfa antibiotics; and Sulfonamide derivatives  Family History  Problem Relation Age of Onset  . Hypertension Mother   . Stroke Mother   . Cancer Father     gallbladder  . Heart disease Brother     Social History Social History  Substance Use Topics  . Smoking status: Never Smoker   . Smokeless tobacco: Never Used  . Alcohol Use: No    Review of Systems Constitutional: No fever/chills Eyes: No visual changes. ENT: No sore throat. Cardiovascular: Denies chest pain. Respiratory: Denies shortness  of breath. Gastrointestinal: No abdominal pain.  Positive for nausea, vomiting and diarrhea.  No constipation. Genitourinary: Negative for dysuria. Musculoskeletal: Negative for back pain. Skin: Negative for rash. Neurological: Negative for headaches, focal weakness or numbness.  10-point ROS otherwise negative.  ____________________________________________   PHYSICAL EXAM:  VITAL SIGNS: ED Triage Vitals  Enc Vitals Group     BP --      Pulse --      Resp --      Temp --      Temp src --      SpO2 --      Weight --      Height --      Head Cir --      Peak Flow --      Pain Score --      Pain Loc --      Pain Edu? --      Excl. in GC? --     Constitutional: Unresponsive Eyes: Conjunctivae are normal. PERRL. EOMI. Head: Atraumatic. Nose: No congestion/rhinnorhea. Mouth/Throat: Mucous membranes are dry. Neck: No stridor.   Cardiovascular: Pulseless.  Respiratory: No respiratory effort.  Gastrointestinal: Soft. No distention. No abdominal bruits.  Musculoskeletal: No lower extremity edema.  No joint effusions. Neurologic:  Unresponsive.  Skin:  Skin is cool and mottled. No rash noted. Psychiatric: Unable to assess. ____________________________________________   LABS (all labs ordered are listed, but only abnormal results are displayed)  Labs Reviewed  BLOOD GAS, ARTERIAL - Abnormal; Notable for the following:    pH, Arterial 7.08 (*)    pCO2 arterial 56 (*)    pO2, Arterial 123 (*)    Bicarbonate 16.6 (*)    Acid-base deficit 12.8 (*)    All other components within normal limits  CBC WITH DIFFERENTIAL/PLATELET - Abnormal; Notable for the following:    RBC 3.56 (*)    Hemoglobin 7.3 (*)    HCT 26.4 (*)    MCV 74.1 (*)    MCH 20.4 (*)    MCHC 27.5 (*)    RDW 16.9 (*)    Platelets 131 (*)    Neutro Abs 7.2 (*)    Lymphs Abs 0.8 (*)    All other components within normal limits  PROTIME-INR - Abnormal; Notable for  the following:    Prothrombin Time 29.1  (*)    All other components within normal limits  BLOOD GAS, ARTERIAL - Abnormal; Notable for the following:    pH, Arterial 6.99 (*)    pCO2 arterial 57 (*)    pO2, Arterial 145 (*)    Bicarbonate 13.7 (*)    Acid-base deficit 18.0 (*)    Allens test (pass/fail) POSITIVE (*)    All other components within normal limits  CULTURE, BLOOD (ROUTINE X 2)  CULTURE, BLOOD (ROUTINE X 2)  GASTROINTESTINAL PANEL BY PCR, STOOL (REPLACES STOOL CULTURE)  C DIFFICILE QUICK SCREEN W PCR REFLEX  COMPREHENSIVE METABOLIC PANEL  LIPASE, BLOOD  TROPONIN I  LACTIC ACID, PLASMA  LACTIC ACID, PLASMA   ____________________________________________  EKG  ED ECG REPORT I, SUNG,JADE J, the attending physician, personally viewed and interpreted this ECG.   Date: 11-05-15  EKG Time: 0449  Rate: 123  Rhythm: atrial fibrillation, rate 123  Axis: LBBB  Intervals:none  ST&T Change: ST depression  ____________________________________________  RADIOLOGY  Portable chest xray (viewed by me, interpreted per Dr. Phill Myron): 1. Tip of the endotracheal tube approximately 3.6 cm above the carina. 2. Patchy right upper lobe opacity. While this finding may in part be related to patient rotation, possible infiltrate or even aspiration could be considered. ____________________________________________   PROCEDURES  Procedure(s) performed:   INTUBATION Performed by: RT Joe  Required items: required blood products, implants, devices, and special equipment available Patient identity confirmed: provided demographic data and hospital-assigned identification number Time out: Immediately prior to procedure a "time out" was called to verify the correct patient, procedure, equipment, support staff and site/side marked as required.  Indications: Airway protection  Intubation method: Glidescope Laryngoscopy   Preoxygenation: BVM  Sedatives: None Paralytic: None  Tube Size: 7.5 cuffed  Post-procedure  assessment: chest rise and ETCO2 monitor Breath sounds: equal and absent over the epigastrium Tube secured with: ETT holder Chest x-ray interpreted by radiologist and me.  Chest x-ray findings: endotracheal tube in appropriate position  Patient tolerated the procedure well and CPR continued.     Critical Care performed: Yes, see critical care note(s)  CRITICAL CARE Performed by: Irean Hong   Total critical care time: 60 minutes  Critical care time was exclusive of separately billable procedures and treating other patients.  Critical care was necessary to treat or prevent imminent or life-threatening deterioration.  Critical care was time spent personally by me on the following activities: development of treatment plan with patient and/or surrogate as well as nursing, discussions with consultants, evaluation of patient's response to treatment, examination of patient, obtaining history from patient or surrogate, ordering and performing treatments and interventions, ordering and review of laboratory studies, ordering and review of radiographic studies, pulse oximetry and re-evaluation of patient's condition. ____________________________________________   INITIAL IMPRESSION / ASSESSMENT AND PLAN / ED COURSE  Pertinent labs & imaging results that were available during my care of the patient were reviewed by me and considered in my medical decision making (see chart for details).  75 year old female with recent flulike symptoms with cough, including nausea, vomiting and diarrhea. She became unresponsive and pulseless upon arrival to the ED. CPR and resuscitative measures initiated promptly. Patient was intubated by respiratory therapist for airway protection. She had a large amount of emesis prior to intubation. After intubation there was blood in the ET tube. Resuscitative efforts continued and return of spontaneous circulation obtained. See nursing flow sheet for further  details.  ----------------------------------------- 5:31  AM on 2015-11-03 -----------------------------------------  ED course since time of arrival approximately 3:45 AM: Patient lost pulses and required CPR/resuscitation an additional 2 times. Extensive discussion with her 3 adult children regarding grim prognosis; at this time they wish for patient to remain full code. She received 3 L of IV fluids and is currently on 3 vasopressors with systolic blood pressure in the 70s. Laboratory results remarkable for elevated lactic acid and troponin. IV antibiotics were ordered for healthcare acquired pneumonia. Enteric precautions placed for suspected norovirus. I have again discussed with the patient's family that patient is in grave condition with multi organ failure close to follow and death is extremely likely. At this point they wish for patient to remain on the ventilator with vasopressors, IV antibiotics and fluids infusing. Discussed with hospitalist for admission. ____________________________________________   FINAL CLINICAL IMPRESSION(S) / ED DIAGNOSES  Final diagnoses:  Sepsis, due to unspecified organism (HCC)  Hypotension, unspecified hypotension type  Cardiac arrest (HCC)  Hypokalemia  Dehydration  Non-intractable vomiting with nausea, vomiting of unspecified type  Diarrhea, unspecified type  Atrial fibrillation, unspecified type (HCC)  HCAP (healthcare-associated pneumonia)      Irean Hong, MD November 03, 2015 [phone]

## 2015-10-17 NOTE — ED Notes (Signed)
Pt unresponsive, HR steadily dropping to 40-50's; MD at bedside, CPR initiated.

## 2015-10-17 NOTE — ED Notes (Signed)
3rd dose of Epi given at this time; CPR in progress.

## 2015-10-17 NOTE — ED Notes (Signed)
2nd dose of Bicarb given IVP.

## 2015-10-17 NOTE — ED Notes (Addendum)
CPR paused for HR and heart rhythm check. (+) HR and pulse, CPR discontinued at this time.

## 2015-10-17 DEATH — deceased

## 2015-11-16 ENCOUNTER — Encounter: Payer: Self-pay | Admitting: Unknown Physician Specialty

## 2016-08-01 IMAGING — CR DG CHEST 1V PORT
1 series · 1 of 1 positions shown · non-contrast
Comparison: None.

CLINICAL DATA: Initial evaluation for code fluid, intubation.

EXAM:
PORTABLE CHEST 1 VIEW

[portable]
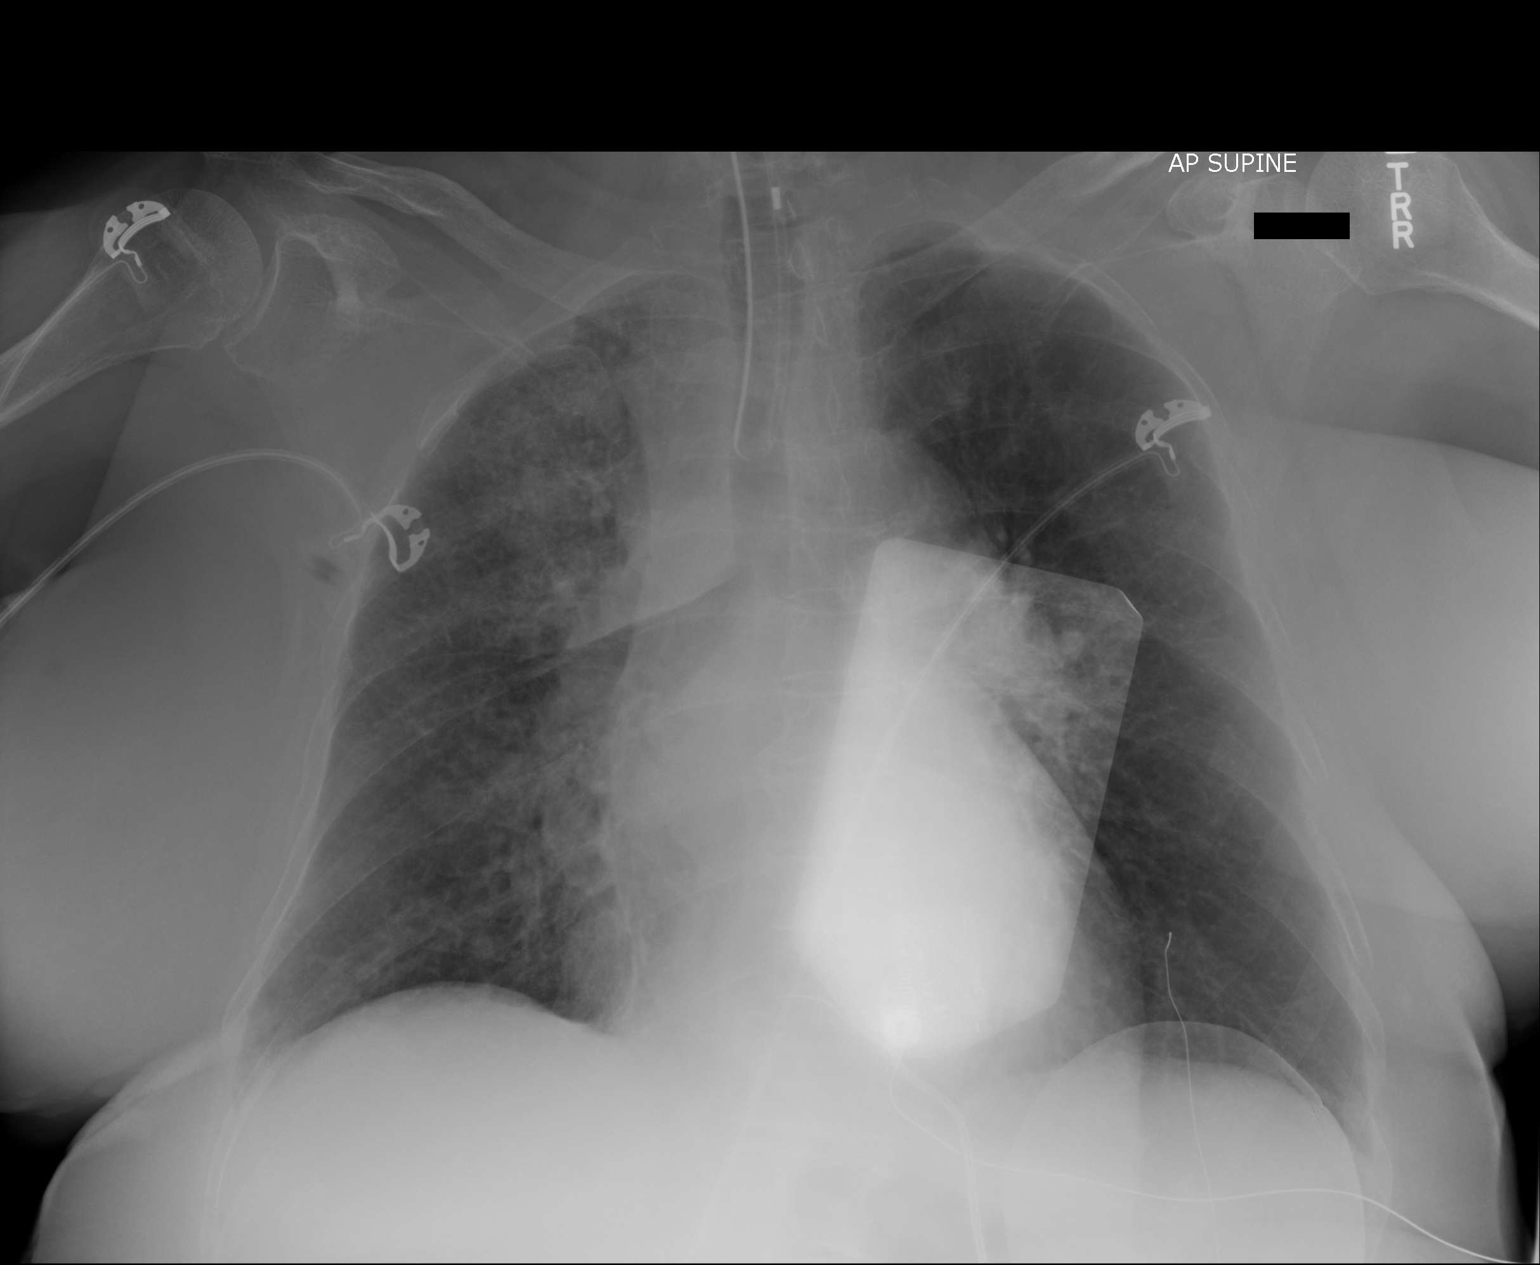

[1 of 1 positions shown; findings below may reference images not displayed]

FINDINGS: Patient is slightly rotated to the right.

Endotracheal tube in place with tip approximately 3.6 cm above the
carina. Cardiac silhouette within normal limits. Mediastinal
silhouette grossly normal.

The for motor pad overlies left chest. Patchy opacity within the
right upper lobe, which may reflect infiltrate or even possibly
aspiration. No other focal airspace disease. No pulmonary edema or
pleural effusion. No pneumothorax.

No acute osseus abnormality.
IMPRESSION: 1. Tip of the endotracheal tube approximately 3.6 cm above the
carina.
2. Patchy right upper lobe opacity. While this finding may in part
be related to patient rotation, possible infiltrate or even
aspiration could be considered.
